# Patient Record
Sex: Female | Born: 1950 | Race: White | Hispanic: No | Smoking: Current every day smoker
Health system: Southern US, Community
[De-identification: ages and names within clinical notes are randomized; demographics above are authoritative.]

## PROBLEM LIST (undated history)

## (undated) DIAGNOSIS — C801 Malignant (primary) neoplasm, unspecified: Secondary | ICD-10-CM

## (undated) DIAGNOSIS — IMO0002 Reserved for concepts with insufficient information to code with codable children: Secondary | ICD-10-CM

## (undated) DIAGNOSIS — G43909 Migraine, unspecified, not intractable, without status migrainosus: Secondary | ICD-10-CM

## (undated) DIAGNOSIS — M1990 Unspecified osteoarthritis, unspecified site: Secondary | ICD-10-CM

## (undated) DIAGNOSIS — M329 Systemic lupus erythematosus, unspecified: Secondary | ICD-10-CM

## (undated) HISTORY — PX: CHOLECYSTECTOMY: SHX55

## (undated) HISTORY — PX: ABDOMINAL HYSTERECTOMY: SHX81

---

## 2017-09-23 ENCOUNTER — Other Ambulatory Visit: Payer: Self-pay

## 2017-09-23 ENCOUNTER — Emergency Department (HOSPITAL_COMMUNITY)
Admission: EM | Admit: 2017-09-23 | Discharge: 2017-09-23 | Disposition: A | Discharge status: Home / Self Care | Payer: Medicare Other | Attending: Emergency Medicine | Admitting: Emergency Medicine

## 2017-09-23 ENCOUNTER — Encounter (HOSPITAL_COMMUNITY): Payer: Self-pay | Admitting: *Deleted

## 2017-09-23 ENCOUNTER — Emergency Department (HOSPITAL_COMMUNITY): Payer: Medicare Other

## 2017-09-23 DIAGNOSIS — R52 Pain, unspecified: Secondary | ICD-10-CM | POA: Diagnosis present

## 2017-09-23 DIAGNOSIS — J111 Influenza due to unidentified influenza virus with other respiratory manifestations: Secondary | ICD-10-CM

## 2017-09-23 DIAGNOSIS — J029 Acute pharyngitis, unspecified: Secondary | ICD-10-CM | POA: Insufficient documentation

## 2017-09-23 DIAGNOSIS — Z87891 Personal history of nicotine dependence: Secondary | ICD-10-CM | POA: Diagnosis not present

## 2017-09-23 DIAGNOSIS — R0602 Shortness of breath: Secondary | ICD-10-CM | POA: Insufficient documentation

## 2017-09-23 DIAGNOSIS — R05 Cough: Secondary | ICD-10-CM | POA: Insufficient documentation

## 2017-09-23 DIAGNOSIS — R51 Headache: Secondary | ICD-10-CM | POA: Diagnosis not present

## 2017-09-23 DIAGNOSIS — Z85118 Personal history of other malignant neoplasm of bronchus and lung: Secondary | ICD-10-CM | POA: Insufficient documentation

## 2017-09-23 DIAGNOSIS — R69 Illness, unspecified: Secondary | ICD-10-CM

## 2017-09-23 DIAGNOSIS — J449 Chronic obstructive pulmonary disease, unspecified: Secondary | ICD-10-CM | POA: Insufficient documentation

## 2017-09-23 DIAGNOSIS — R197 Diarrhea, unspecified: Secondary | ICD-10-CM | POA: Diagnosis not present

## 2017-09-23 HISTORY — DX: Reserved for concepts with insufficient information to code with codable children: IMO0002

## 2017-09-23 HISTORY — DX: Unspecified osteoarthritis, unspecified site: M19.90

## 2017-09-23 HISTORY — DX: Migraine, unspecified, not intractable, without status migrainosus: G43.909

## 2017-09-23 HISTORY — DX: Systemic lupus erythematosus, unspecified: M32.9

## 2017-09-23 HISTORY — DX: Malignant (primary) neoplasm, unspecified: C80.1

## 2017-09-23 LAB — CBC WITH DIFFERENTIAL/PLATELET
BASOS ABS: 0 10*3/uL (ref 0.0–0.1)
BASOS PCT: 0 %
EOS ABS: 0 10*3/uL (ref 0.0–0.7)
Eosinophils Relative: 0 %
HEMATOCRIT: 43.2 % (ref 36.0–46.0)
HEMOGLOBIN: 13.7 g/dL (ref 12.0–15.0)
Lymphocytes Relative: 17 %
Lymphs Abs: 0.9 10*3/uL (ref 0.7–4.0)
MCH: 30.2 pg (ref 26.0–34.0)
MCHC: 31.7 g/dL (ref 30.0–36.0)
MCV: 95.4 fL (ref 78.0–100.0)
Monocytes Absolute: 1.1 10*3/uL — ABNORMAL HIGH (ref 0.1–1.0)
Monocytes Relative: 21 %
NEUTROS ABS: 3.4 10*3/uL (ref 1.7–7.7)
NEUTROS PCT: 62 %
Platelets: 159 10*3/uL (ref 150–400)
RBC: 4.53 MIL/uL (ref 3.87–5.11)
RDW: 14.6 % (ref 11.5–15.5)
WBC: 5.4 10*3/uL (ref 4.0–10.5)

## 2017-09-23 LAB — COMPREHENSIVE METABOLIC PANEL
ALK PHOS: 56 U/L (ref 38–126)
ALT: 26 U/L (ref 14–54)
ANION GAP: 11 (ref 5–15)
AST: 53 U/L — ABNORMAL HIGH (ref 15–41)
Albumin: 3.5 g/dL (ref 3.5–5.0)
BILIRUBIN TOTAL: 0.6 mg/dL (ref 0.3–1.2)
BUN: 15 mg/dL (ref 6–20)
CALCIUM: 8.1 mg/dL — AB (ref 8.9–10.3)
CO2: 23 mmol/L (ref 22–32)
CREATININE: 0.85 mg/dL (ref 0.44–1.00)
Chloride: 101 mmol/L (ref 101–111)
Glucose, Bld: 73 mg/dL (ref 65–99)
Potassium: 3.1 mmol/L — ABNORMAL LOW (ref 3.5–5.1)
SODIUM: 135 mmol/L (ref 135–145)
TOTAL PROTEIN: 7.2 g/dL (ref 6.5–8.1)

## 2017-09-23 MED ORDER — ACETAMINOPHEN 500 MG PO TABS
1000.0000 mg | ORAL_TABLET | Freq: Once | ORAL | Status: AC
  Administered 2017-09-23: 1000 mg via ORAL
  Filled 2017-09-23: qty 2

## 2017-09-23 MED ORDER — SODIUM CHLORIDE 0.9 % IV BOLUS (SEPSIS)
2000.0000 mL | Freq: Once | INTRAVENOUS | Status: AC
  Administered 2017-09-23: 2000 mL via INTRAVENOUS

## 2017-09-23 MED ORDER — LOPERAMIDE HCL 2 MG PO CAPS
4.0000 mg | ORAL_CAPSULE | Freq: Once | ORAL | Status: AC
  Administered 2017-09-23: 4 mg via ORAL
  Filled 2017-09-23: qty 2

## 2017-09-23 MED ORDER — POTASSIUM CHLORIDE CRYS ER 20 MEQ PO TBCR
40.0000 meq | EXTENDED_RELEASE_TABLET | Freq: Once | ORAL | Status: AC
  Administered 2017-09-23: 40 meq via ORAL
  Filled 2017-09-23: qty 2

## 2017-09-23 MED ORDER — BENZONATATE 100 MG PO CAPS: 100.0000 mg | ORAL_CAPSULE | Freq: Three times a day (TID) | ORAL | 0 refills | Status: AC

## 2017-09-23 MED ORDER — BENZONATATE 100 MG PO CAPS
100.0000 mg | ORAL_CAPSULE | Freq: Once | ORAL | Status: AC
  Administered 2017-09-23: 100 mg via ORAL
  Filled 2017-09-23: qty 1

## 2017-09-23 NOTE — ED Triage Notes (Signed)
Flu like symptoms

## 2017-09-23 NOTE — Discharge Instructions (Signed)
Make sure that you drink at least six 8 ounce glasses of water or Gatorade each day in order to stay well-hydrated.  Tylenol as directed every 4 hours for aches or for temperature higher than 100.4.  The medication prescribed as needed for cough.  Use your inhalers or nebulizer as directed for shortness of breath.  Avoid milk or foods containing milk such as cheese or ice cream while having diarrhea.  You can take Imodium as directed as needed for diarrhea.  See your primary care physician if not improving in 4 or 5 days.  Return for worsening weakness,shortness of breath, vomiting, or if you are concerned for any reason.

## 2017-09-23 NOTE — ED Provider Notes (Addendum)
[facility] EMERGENCY DEPARTMENT Provider Note   CSN: [id number] Arrival date & time: 09/23/17  1349     History   Chief Complaint Chief Complaint  Patient presents with  . Generalized Body Aches    HPI Carmen Hansen is a 66 y.o. female.  HPI  complains  of generalized weakness, cough productive of clear sputum shortness of breath diarrhea and subjective fever onset 2 days ago.  Other associated symptoms include diffuse myalgias, sore throat, diminished appetite and headache.  No treatment prior to coming here.  Nothing makes symptoms better or worse.  No vomiting or nausea patient reports "I think I have the flu" Past Medical History:  Diagnosis Date  . Arthritis   . Cancer ([place])   . Lupus   . Migraines   Lung cancer-resolved COPD     OB History    No data available       Home Medications   Symbicort, Zyrtec,chloecalciferol, cyclobenzaprine, Dexilant docusate Cymbalta folic acid gabapentin Plaquenil DuoNeb, Singulair, prednisone 5 mg daily Maxalt Spiriva topiramate tramadol Ambien Prior to Admission medications   Not on File    Family History No family history on file.  Social History Social History   Tobacco Use  . Smoking status: Former Research scientist (life sciences)  . Smokeless tobacco: Never Used  Substance Use Topics  . Alcohol use: No    Frequency: Never  . Drug use: No     Allergies   Arava [leflunomide]; Daliresp [roflumilast]; Lortab [hydrocodone-acetaminophen]; and Sulfa antibiotics   Review of Systems Review of Systems  Constitutional: Positive for appetite change and fever.       Subjective fever  HENT: Negative.   Respiratory: Positive for cough and shortness of breath.   Cardiovascular: Negative.   Gastrointestinal: Positive for diarrhea.  Musculoskeletal: Positive for myalgias.       Diffuse myalgias  Skin: Negative.   Allergic/Immunologic: Positive for immunocompromised state.       Chronically on steroids  Neurological: Positive for weakness.    Psychiatric/Behavioral: Negative.   All other systems reviewed and are negative.    Physical Exam Updated Vital Signs BP 129/72   Pulse 88   Temp 98.7 F (37.1 C)   Resp (!) 24   Ht 5' 5.5" (1.664 m)   Wt 88.9 kg (196 lb)   SpO2 98%   BMI 32.12 kg/m   Physical Exam  Constitutional: She is oriented to person, place, and time. She appears well-developed and well-nourished.  Mildly ill-appearing nontoxic  HENT:  Head: Normocephalic and atraumatic.  Mucous membranes dry  Eyes: Conjunctivae are normal. Pupils are equal, round, and reactive to light.  Neck: Neck supple. No tracheal deviation present. No thyromegaly present.  Cardiovascular: Normal rate and regular rhythm.  No murmur heard. Pulmonary/Chest: Effort normal and breath sounds normal.  Abdominal: Soft. Bowel sounds are normal. She exhibits no distension. There is no tenderness.  Musculoskeletal: Normal range of motion. She exhibits no edema or tenderness.  Neurological: She is alert and oriented to person, place, and time. Coordination normal.  Skin: Skin is warm and dry. No rash noted.  Psychiatric: She has a normal mood and affect.  Nursing note and vitals reviewed.    ED Treatments / Results  Labs (all labs ordered are listed, but only abnormal results are displayed) Labs Reviewed  COMPREHENSIVE METABOLIC PANEL  CBC WITH DIFFERENTIAL/PLATELET    EKG  EKG Interpretation None      Chest x-ray reviewed by me Results for orders placed or performed  during the hospital encounter of 09/23/17  Comprehensive metabolic panel  Result Value Ref Range   Sodium 135 135 - 145 mmol/L   Potassium 3.1 (L) 3.5 - 5.1 mmol/L   Chloride 101 101 - 111 mmol/L   CO2 23 22 - 32 mmol/L   Glucose, Bld 73 65 - 99 mg/dL   BUN 15 6 - 20 mg/dL   Creatinine, Ser 0.85 0.44 - 1.00 mg/dL   Calcium 8.1 (L) 8.9 - 10.3 mg/dL   Total Protein 7.2 6.5 - 8.1 g/dL   Albumin 3.5 3.5 - 5.0 g/dL   AST 53 (H) 15 - 41 U/L   ALT 26 14 - 54  U/L   Alkaline Phosphatase 56 38 - 126 U/L   Total Bilirubin 0.6 0.3 - 1.2 mg/dL   GFR calc non Af Amer >60 >60 mL/min   GFR calc Af Amer >60 >60 mL/min   Anion gap 11 5 - 15  CBC with Differential/Platelet  Result Value Ref Range   WBC 5.4 4.0 - 10.5 K/uL   RBC 4.53 3.87 - 5.11 MIL/uL   Hemoglobin 13.7 12.0 - 15.0 g/dL   HCT 43.2 36.0 - 46.0 %   MCV 95.4 78.0 - 100.0 fL   MCH 30.2 26.0 - 34.0 pg   MCHC 31.7 30.0 - 36.0 g/dL   RDW 14.6 11.5 - 15.5 %   Platelets 159 150 - 400 K/uL   Neutrophils Relative % 62 %   Neutro Abs 3.4 1.7 - 7.7 K/uL   Lymphocytes Relative 17 %   Lymphs Abs 0.9 0.7 - 4.0 K/uL   Monocytes Relative 21 %   Monocytes Absolute 1.1 (H) 0.1 - 1.0 K/uL   Eosinophils Relative 0 %   Eosinophils Absolute 0.0 0.0 - 0.7 K/uL   Basophils Relative 0 %   Basophils Absolute 0.0 0.0 - 0.1 K/uL   Dg Chest 2 View  Result Date: 09/23/2017 CLINICAL DATA:  66 year old female presenting with chills, cough, and congestion for a few days. EXAM: CHEST  2 VIEW COMPARISON:  None. FINDINGS: The heart size and mediastinal contours are within normal limits. No alveolar consolidations. Mild bronchitic change of both lungs with left basilar atelectasis. No effusion nor overt pulmonary edema. Status post vertebral augmentation at what appears to be L1. The visualized skeletal structures are nonacute. IMPRESSION: Mild interstitial prominence which may reflect chronic bronchitic change. No alveolar consolidation to suggest pneumonia. Electronically Signed   By: Ashley Royalty M.D.   On: 09/23/2017 17:27   Radiology No results found.  Procedures Procedures (including critical care time)  Medications Ordered in ED Medications  sodium chloride 0.9 % bolus 2,000 mL (not administered)  loperamide (IMODIUM) capsule 4 mg (not administered)     Initial Impression / Assessment and Plan / ED Course  I have reviewed the triage vital signs and the nursing notes.  Pertinent labs & imaging results  that were available during my care of the patient were reviewed by me and considered in my medical decision making (see chart for details).     9 PM patient feels much improved after treatment with intravenous hydration and oral potassium.  Coughing less after treatment with Tessalon.  She feels ready to go home.plan Tylenol for aches or for temperature higher than 100.4.  Encourage oral hydration.  Prescription Tessalon.  Imodium as directed.  Avoid dairy.  She is to use her inhalers as directed as needed for shortness of breath.  Follow-up with primary care physician if  not improved in 4-5 days.  Return if condition worsens for any reason, shortness of breath, vomiting/ Final Clinical Impressions(s) / ED Diagnoses  Dx #1 influenza-like illness #2 diarrhea #3 hypokalemia #4 dehydration Final diagnoses:  None    ED Discharge Orders    None       Orlie Dakin, MD 09/23/17 2112    Orlie Dakin, MD 09/23/17 2116

## 2018-06-11 DIAGNOSIS — K589 Irritable bowel syndrome without diarrhea: Secondary | ICD-10-CM | POA: Insufficient documentation

## 2018-06-11 DIAGNOSIS — I7300 Raynaud's syndrome without gangrene: Secondary | ICD-10-CM | POA: Insufficient documentation

## 2018-06-11 DIAGNOSIS — M810 Age-related osteoporosis without current pathological fracture: Secondary | ICD-10-CM | POA: Insufficient documentation

## 2018-06-11 DIAGNOSIS — K5790 Diverticulosis of intestine, part unspecified, without perforation or abscess without bleeding: Secondary | ICD-10-CM | POA: Insufficient documentation

## 2018-06-11 DIAGNOSIS — M797 Fibromyalgia: Secondary | ICD-10-CM | POA: Insufficient documentation

## 2018-06-11 DIAGNOSIS — J45909 Unspecified asthma, uncomplicated: Secondary | ICD-10-CM | POA: Insufficient documentation

## 2018-07-21 DIAGNOSIS — E559 Vitamin D deficiency, unspecified: Secondary | ICD-10-CM | POA: Insufficient documentation

## 2018-07-21 DIAGNOSIS — M06041 Rheumatoid arthritis without rheumatoid factor, right hand: Secondary | ICD-10-CM | POA: Insufficient documentation

## 2018-08-28 DIAGNOSIS — G4700 Insomnia, unspecified: Secondary | ICD-10-CM | POA: Insufficient documentation

## 2018-11-25 IMAGING — DX DG CHEST 2V
2 series · 2 of 2 positions shown · non-contrast
Comparison: None.

CLINICAL DATA: 66-year-old female presenting with chills, cough,
and congestion for a few days.

EXAM:
CHEST  2 VIEW

[chest pa]
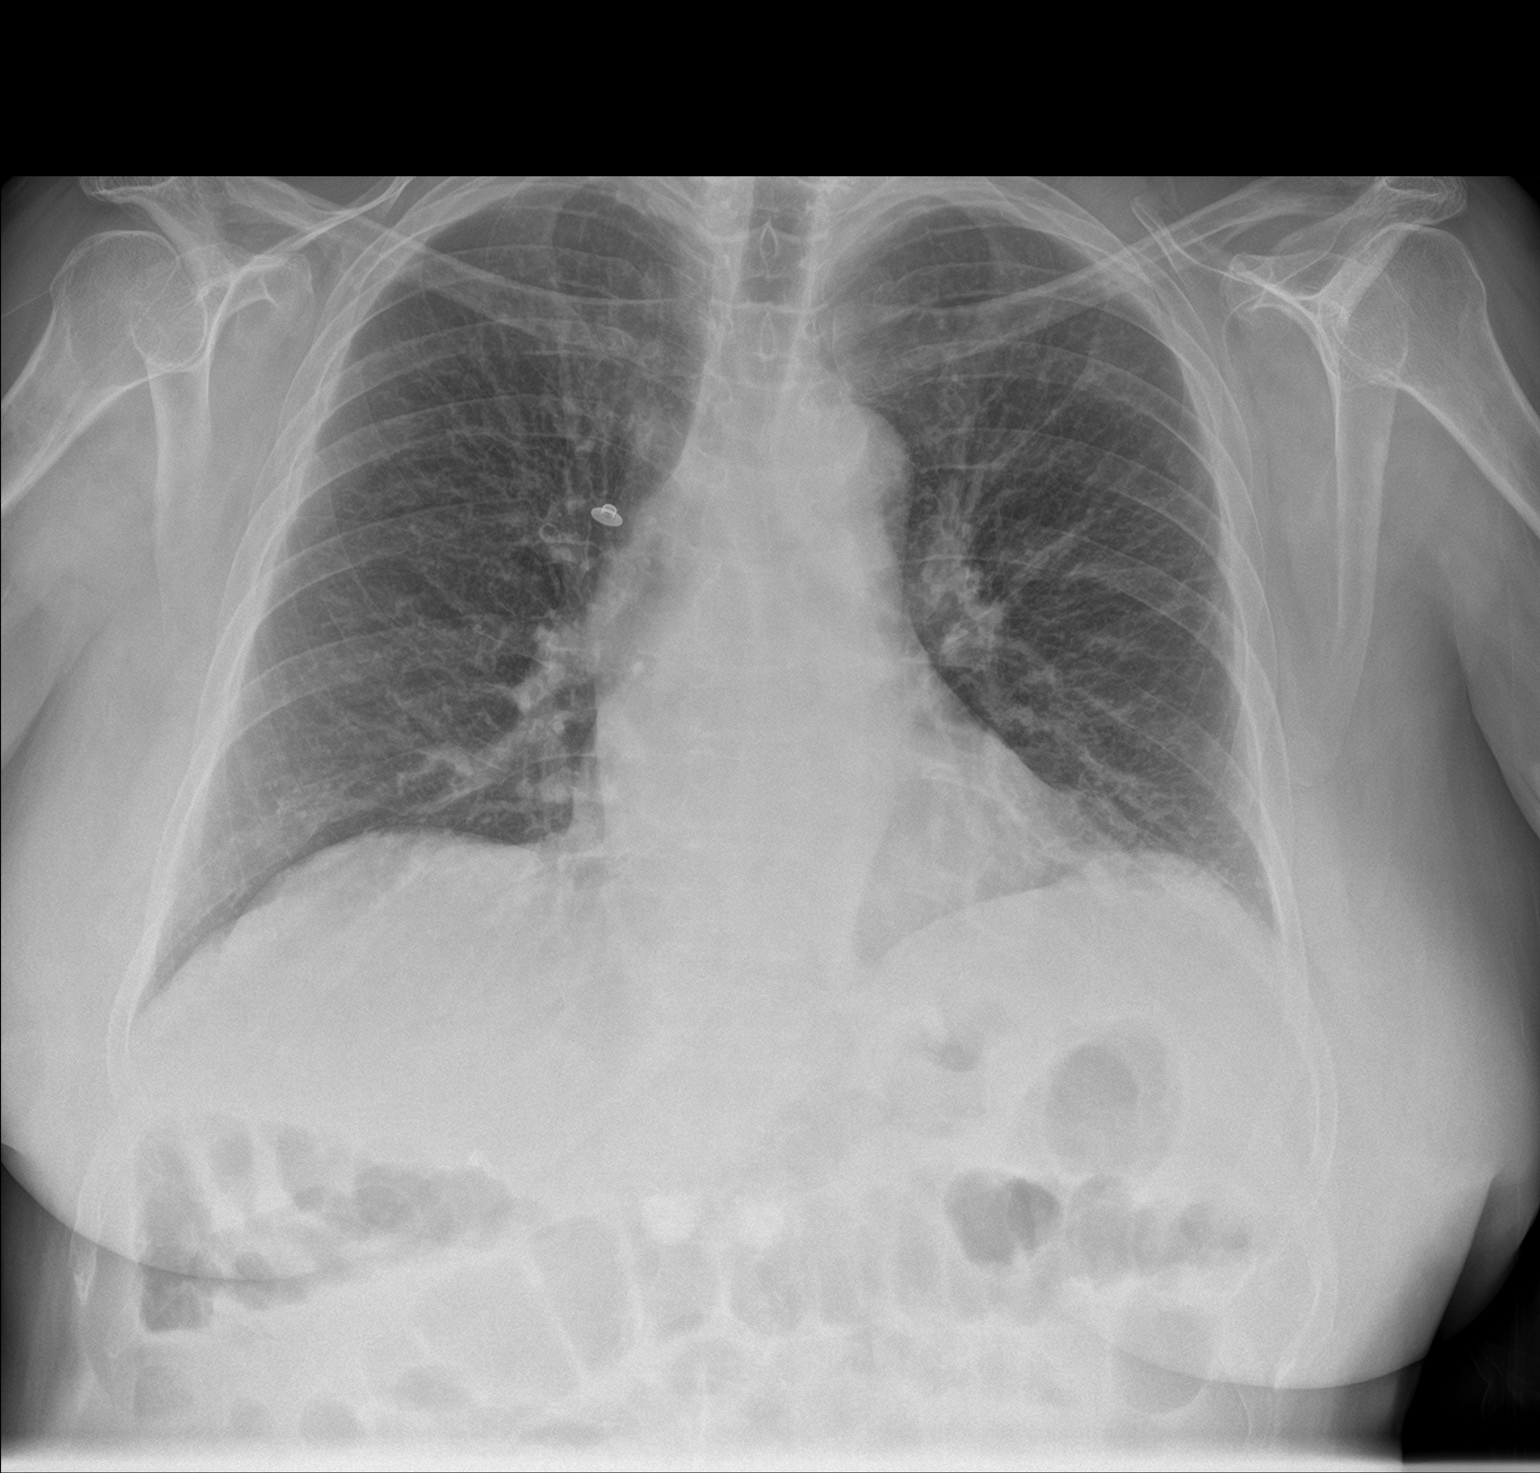

[chest lat]
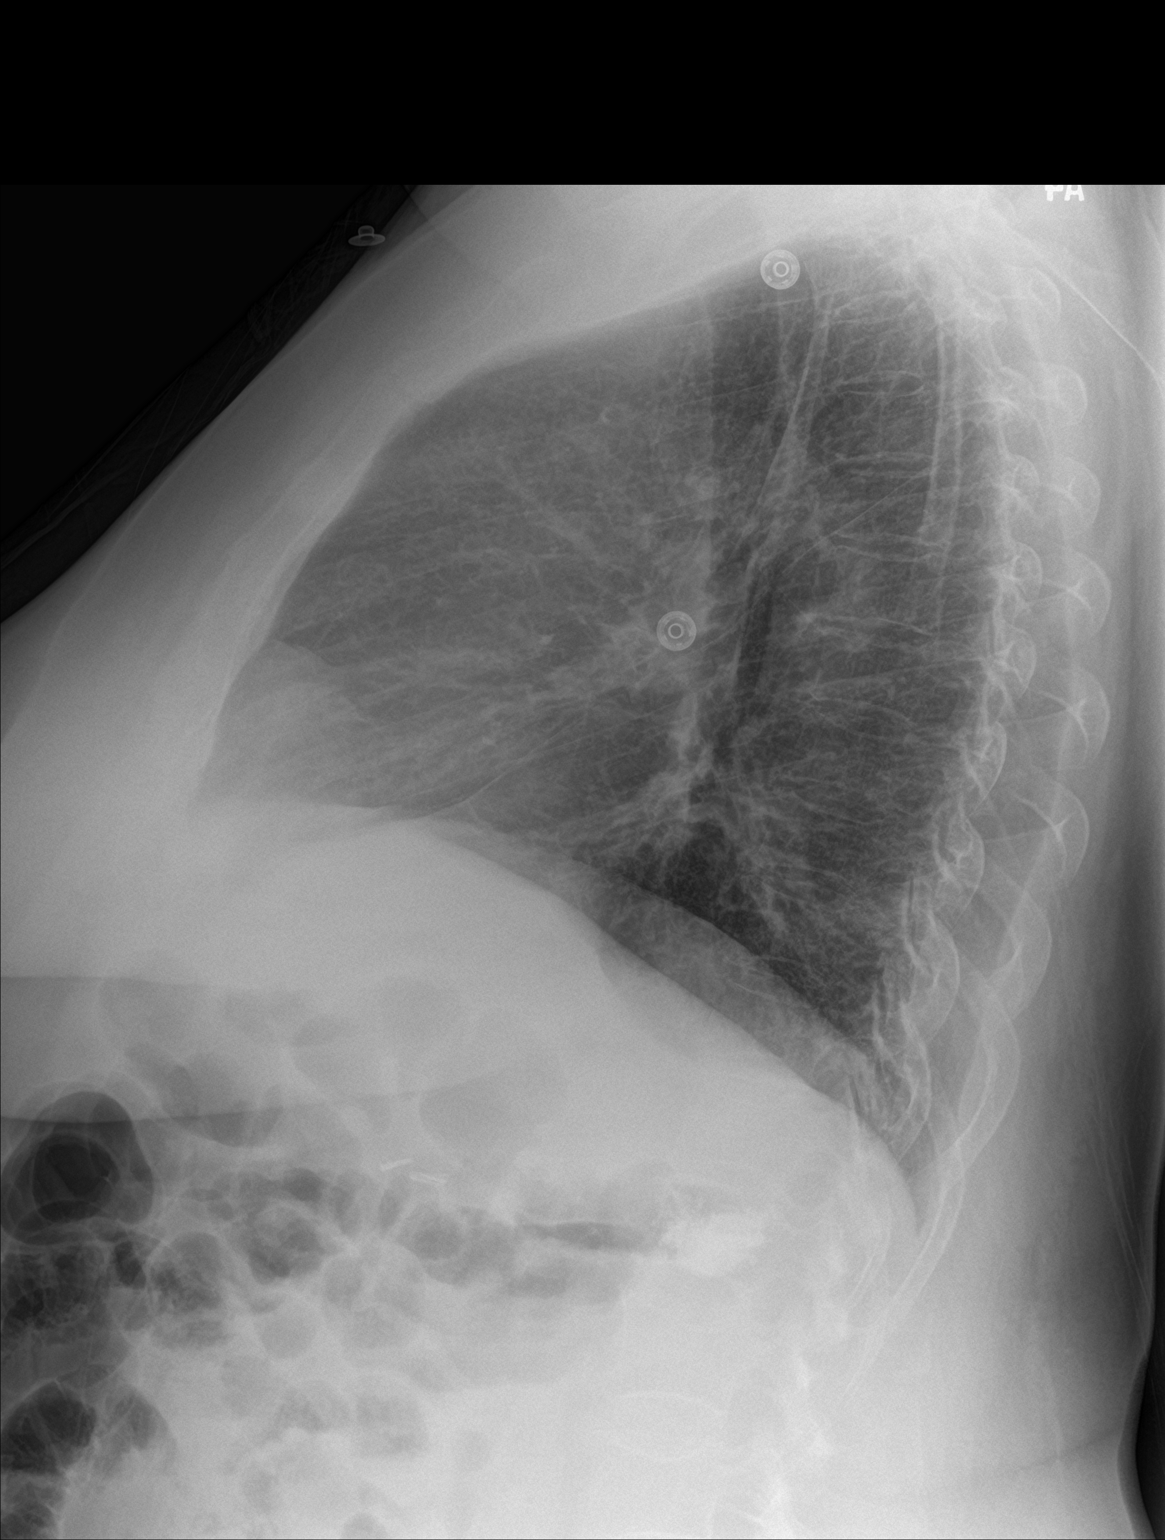

[2 of 2 positions shown; findings below may reference images not displayed]

FINDINGS: The heart size and mediastinal contours are within normal limits. No
alveolar consolidations. Mild bronchitic change of both lungs with
left basilar atelectasis. No effusion nor overt pulmonary edema.
Status post vertebral augmentation at what appears to be L1. The
visualized skeletal structures are nonacute.
IMPRESSION: Mild interstitial prominence which may reflect chronic bronchitic
change. No alveolar consolidation to suggest pneumonia.

## 2019-09-13 DIAGNOSIS — M160 Bilateral primary osteoarthritis of hip: Secondary | ICD-10-CM | POA: Insufficient documentation

## 2019-09-30 DIAGNOSIS — B001 Herpesviral vesicular dermatitis: Secondary | ICD-10-CM | POA: Insufficient documentation

## 2019-12-29 DIAGNOSIS — K219 Gastro-esophageal reflux disease without esophagitis: Secondary | ICD-10-CM | POA: Insufficient documentation

## 2020-12-28 DIAGNOSIS — E669 Obesity, unspecified: Secondary | ICD-10-CM | POA: Insufficient documentation

## 2021-03-26 DIAGNOSIS — M546 Pain in thoracic spine: Secondary | ICD-10-CM | POA: Insufficient documentation

## 2021-05-24 DIAGNOSIS — M1990 Unspecified osteoarthritis, unspecified site: Secondary | ICD-10-CM | POA: Insufficient documentation

## 2021-05-29 DIAGNOSIS — Z9889 Other specified postprocedural states: Secondary | ICD-10-CM | POA: Insufficient documentation

## 2021-08-20 DIAGNOSIS — F419 Anxiety disorder, unspecified: Secondary | ICD-10-CM | POA: Insufficient documentation

## 2022-06-03 DIAGNOSIS — I10 Essential (primary) hypertension: Secondary | ICD-10-CM | POA: Insufficient documentation

## 2022-07-19 LAB — HM COLONOSCOPY

## 2023-09-28 ENCOUNTER — Encounter (HOSPITAL_COMMUNITY): Payer: Self-pay

## 2023-09-28 ENCOUNTER — Other Ambulatory Visit: Payer: Self-pay

## 2023-09-28 ENCOUNTER — Emergency Department (HOSPITAL_COMMUNITY): Payer: Medicare Other

## 2023-09-28 ENCOUNTER — Emergency Department (HOSPITAL_COMMUNITY)
Admission: EM | Admit: 2023-09-28 | Discharge: 2023-09-28 | Disposition: A | Discharge status: Home / Self Care | Payer: Medicare Other | Attending: Emergency Medicine | Admitting: Emergency Medicine

## 2023-09-28 DIAGNOSIS — D509 Iron deficiency anemia, unspecified: Secondary | ICD-10-CM | POA: Insufficient documentation

## 2023-09-28 DIAGNOSIS — R0602 Shortness of breath: Secondary | ICD-10-CM | POA: Diagnosis present

## 2023-09-28 DIAGNOSIS — R600 Localized edema: Secondary | ICD-10-CM | POA: Insufficient documentation

## 2023-09-28 DIAGNOSIS — J449 Chronic obstructive pulmonary disease, unspecified: Secondary | ICD-10-CM | POA: Diagnosis not present

## 2023-09-28 LAB — CBC WITH DIFFERENTIAL/PLATELET
Abs Immature Granulocytes: 0.01 10*3/uL (ref 0.00–0.07)
Basophils Absolute: 0.1 10*3/uL (ref 0.0–0.1)
Basophils Relative: 1 %
Eosinophils Absolute: 0 10*3/uL (ref 0.0–0.5)
Eosinophils Relative: 1 %
HCT: 28.5 % — ABNORMAL LOW (ref 36.0–46.0)
Hemoglobin: 8.1 g/dL — ABNORMAL LOW (ref 12.0–15.0)
Immature Granulocytes: 0 %
Lymphocytes Relative: 17 %
Lymphs Abs: 1 10*3/uL (ref 0.7–4.0)
MCH: 22.3 pg — ABNORMAL LOW (ref 26.0–34.0)
MCHC: 28.4 g/dL — ABNORMAL LOW (ref 30.0–36.0)
MCV: 78.3 fL — ABNORMAL LOW (ref 80.0–100.0)
Monocytes Absolute: 0.4 10*3/uL (ref 0.1–1.0)
Monocytes Relative: 7 %
Neutro Abs: 4.6 10*3/uL (ref 1.7–7.7)
Neutrophils Relative %: 74 %
Platelets: 250 10*3/uL (ref 150–400)
RBC: 3.64 MIL/uL — ABNORMAL LOW (ref 3.87–5.11)
RDW: 18.7 % — ABNORMAL HIGH (ref 11.5–15.5)
WBC: 6.1 10*3/uL (ref 4.0–10.5)
nRBC: 0 % (ref 0.0–0.2)

## 2023-09-28 LAB — RETICULOCYTES
Immature Retic Fract: 27.9 % — ABNORMAL HIGH (ref 2.3–15.9)
RBC.: 3.69 MIL/uL — ABNORMAL LOW (ref 3.87–5.11)
Retic Count, Absolute: 84.1 10*3/uL (ref 19.0–186.0)
Retic Ct Pct: 2.3 % (ref 0.4–3.1)

## 2023-09-28 LAB — BLOOD GAS, VENOUS
Acid-Base Excess: 1.7 mmol/L (ref 0.0–2.0)
Bicarbonate: 26.6 mmol/L (ref 20.0–28.0)
Drawn by: 65579
O2 Saturation: 47.3 %
Patient temperature: 37.1
pCO2, Ven: 42 mm[Hg] — ABNORMAL LOW (ref 44–60)
pH, Ven: 7.41 (ref 7.25–7.43)
pO2, Ven: <31 mm[Hg] — CL (ref 32–45)

## 2023-09-28 LAB — VITAMIN B12: Vitamin B-12: 347 pg/mL (ref 180–914)

## 2023-09-28 LAB — COMPREHENSIVE METABOLIC PANEL
ALT: 16 U/L (ref 0–44)
AST: 19 U/L (ref 15–41)
Albumin: 3.6 g/dL (ref 3.5–5.0)
Alkaline Phosphatase: 75 U/L (ref 38–126)
Anion gap: 9 (ref 5–15)
BUN: 12 mg/dL (ref 8–23)
CO2: 25 mmol/L (ref 22–32)
Calcium: 8.9 mg/dL (ref 8.9–10.3)
Chloride: 102 mmol/L (ref 98–111)
Creatinine, Ser: 0.89 mg/dL (ref 0.44–1.00)
GFR, Estimated: >60 mL/min (ref >60)
Glucose, Bld: 98 mg/dL (ref 70–99)
Potassium: 3.8 mmol/L (ref 3.5–5.1)
Sodium: 136 mmol/L (ref 135–145)
Total Bilirubin: 0.5 mg/dL (ref <1.2)
Total Protein: 6.8 g/dL (ref 6.5–8.1)

## 2023-09-28 LAB — IRON AND TIBC
Iron: 20 ug/dL — ABNORMAL LOW (ref 28–170)
Saturation Ratios: 4 % — ABNORMAL LOW (ref 10.4–31.8)
TIBC: 474 ug/dL — ABNORMAL HIGH (ref 250–450)
UIBC: 454 ug/dL

## 2023-09-28 LAB — FERRITIN: Ferritin: 6 ng/mL — ABNORMAL LOW (ref 11–307)

## 2023-09-28 LAB — FOLATE: Folate: 36.3 ng/mL (ref >5.9)

## 2023-09-28 MED ORDER — FUROSEMIDE 20 MG PO TABS: 20.0000 mg | ORAL_TABLET | Freq: Every day | ORAL | 0 refills | Status: AC

## 2023-09-28 MED ORDER — FERROUS SULFATE 325 (65 FE) MG PO TABS: 325.0000 mg | ORAL_TABLET | Freq: Every day | ORAL | 0 refills | Status: DC

## 2023-09-28 MED ORDER — FUROSEMIDE 10 MG/ML IJ SOLN
40.0000 mg | Freq: Once | INTRAMUSCULAR | Status: AC
Start: 2023-09-28 — End: 2023-09-28
  Administered 2023-09-28: 40 mg via INTRAVENOUS
  Filled 2023-09-28: qty 4

## 2023-09-28 MED ORDER — FERROUS SULFATE 325 (65 FE) MG PO TABS
325.0000 mg | ORAL_TABLET | Freq: Every day | ORAL | 0 refills | Status: DC
Start: 2023-09-28 — End: 2023-09-28

## 2023-09-28 MED ORDER — ALBUTEROL SULFATE HFA 108 (90 BASE) MCG/ACT IN AERS: 2.0000 | INHALATION_SPRAY | RESPIRATORY_TRACT | Status: DC | PRN

## 2023-09-28 MED ORDER — IPRATROPIUM-ALBUTEROL 0.5-2.5 (3) MG/3ML IN SOLN
3.0000 mL | Freq: Once | RESPIRATORY_TRACT | Status: AC
  Administered 2023-09-28: 3 mL via RESPIRATORY_TRACT
  Filled 2023-09-28: qty 3

## 2023-09-28 MED ORDER — FUROSEMIDE 20 MG PO TABS: 20.0000 mg | ORAL_TABLET | Freq: Every day | ORAL | 0 refills | Status: DC

## 2023-09-28 NOTE — ED Provider Notes (Signed)
[place] EMERGENCY DEPARTMENT AT [facility] Provider Note   CSN: [id number] Arrival date & time: 09/28/23  1308     History  Chief Complaint  Patient presents with   Shortness of Breath    Carmen Hansen is a 72 y.o. female.  Pt is a 72 yo female with pmhx significant for copd (on 2-3 L oxygen via [place] at home), arthritis, lupus, migraines, continued tobacco abuse, anemia, chronic back pain, insomnia, hx adenocarcinoma right lung, dm2, fibromyalgia, and IBS.  Pt had been living in [place], [place] until about a week ago.  She had to move in with her sister who lives here.  Pt saw her pcp prior to leaving and was given her medications.  She has made an appt with pcp here, but that is not until Feb.  Pt said she was told to f/u with cards for sob and heme-onc for chronic anemia, but did not make appts because she moved up here.  Pt is here today because of increased sob and bilateral leg swelling.  Pt denies f/c.       Home Medications Prior to Admission medications   Medication Sig Start Date End Date Taking? Authorizing Provider  ferrous sulfate 325 (65 FE) MG tablet Take 1 tablet (325 mg total) by mouth daily. 09/28/23  Yes Jacalyn Lefevre, MD  furosemide (LASIX) 20 MG tablet Take 1 tablet (20 mg total) by mouth daily. 09/28/23  Yes Jacalyn Lefevre, MD  albuterol [facility] HFA) 108 (90 Base) MCG/ACT inhaler Inhale 1-2 puffs into the lungs every 6 (six) hours as needed for wheezing or shortness of breath.  03/10/10   [provider]  benzonatate (TESSALON) 100 MG capsule Take 1 capsule (100 mg total) by mouth every 8 (eight) hours. 09/23/17   Doug Sou, MD  BIOTIN PO Take 1 tablet by mouth daily.    [provider]  budesonide-formoterol (SYMBICORT) 80-4.5 MCG/ACT inhaler Inhale 2 puffs into the lungs 2 (two) times daily.    [provider]  cetirizine (ZYRTEC) 10 MG tablet Take 10 mg by mouth daily.    [provider]  Cholecalciferol  (VITAMIN D3) 5000 units CAPS Take 1 capsule by mouth daily.    [provider]  cyclobenzaprine (FLEXERIL) 5 MG tablet Take 5 mg by mouth at bedtime.    [provider]  dexlansoprazole (DEXILANT) 60 MG capsule Take 60 mg by mouth daily. 03/10/10   [provider]  docusate sodium (COLACE) 100 MG capsule Take 200 mg by mouth daily.    [provider]  DULoxetine (CYMBALTA) 60 MG capsule Take 60 mg by mouth daily. 04/03/10   [provider]  folic acid (FOLVITE) 1 MG tablet Take 1 mg by mouth daily. 03/09/10   [provider]  gabapentin (NEURONTIN) 800 MG tablet Take 800 mg by mouth 4 (four) times daily. 07/29/17   [provider]  hydroxychloroquine (PLAQUENIL) 200 MG tablet Take 200 mg by mouth 2 (two) times daily. 01/10/10   [provider]  ipratropium-albuterol (DUONEB) 0.5-2.5 (3) MG/3ML SOLN Take 3 mLs by nebulization 4 (four) times daily.    [provider]  montelukast (SINGULAIR) 10 MG tablet Take 10 mg by mouth at bedtime. 09/18/17   [provider]  predniSONE (DELTASONE) 5 MG tablet Take 5 mg by mouth daily with breakfast.    [provider]  rizatriptan (MAXALT) 10 MG tablet Take 10 mg by mouth as needed for migraine.  12/21/09   [provider]  SPIRIVA RESPIMAT 2.5 MCG/ACT AERS Inhale 1 spray into the lungs daily. 09/01/17   [provider]  topiramate (TOPAMAX) 50 MG tablet Take 50 mg by mouth 2 (two) times daily.    [provider]  traMADol (ULTRAM) 50 MG tablet Take 50 mg by mouth every 6 (six) hours as needed for moderate pain.    [provider]  zolpidem (AMBIEN) 10 MG tablet Take 10 mg by mouth at bedtime. 03/24/10   [provider]      Allergies    Arava [leflunomide], Daliresp [roflumilast], Lortab [hydrocodone-acetaminophen], and Sulfa antibiotics    Review of Systems   Review of Systems  Respiratory:  Positive for shortness of  breath.   Cardiovascular:  Positive for leg swelling.  All other systems reviewed and are negative.   Physical Exam Updated Vital Signs BP 123/71   Pulse 89   Temp 98.7 F (37.1 C) (Oral)   Resp (!) 26   Ht 5\' 5"  (1.651 m)   Wt 100.2 kg   SpO2 98%   BMI 36.78 kg/m  Physical Exam Vitals and nursing note reviewed.  Constitutional:      Appearance: She is well-developed.  HENT:     Head: Normocephalic and atraumatic.     Mouth/Throat:     Mouth: Mucous membranes are moist.     Pharynx: Oropharynx is clear.  Eyes:     Extraocular Movements: Extraocular movements intact.     Pupils: Pupils are equal, round, and reactive to light.  Cardiovascular:     Rate and Rhythm: Normal rate and regular rhythm.  Pulmonary:     Effort: Pulmonary effort is normal.     Breath sounds: Normal breath sounds.  Abdominal:     General: Bowel sounds are normal.     Palpations: Abdomen is soft.  Musculoskeletal:     Cervical back: Normal range of motion and neck supple.     Right lower leg: Edema present.     Left lower leg: Edema present.  Skin:    General: Skin is warm.     Capillary Refill: Capillary refill takes less than 2 seconds.  Neurological:     General: No focal deficit present.     Mental Status: She is alert and oriented to person, place, and time.  Psychiatric:        Mood and Affect: Mood normal.        Behavior: Behavior normal.     ED Results / Procedures / Treatments   Labs (all labs ordered are listed, but only abnormal results are displayed) Labs Reviewed  CBC WITH DIFFERENTIAL/PLATELET - Abnormal; Notable for the following components:      Result Value   RBC 3.64 (*)    Hemoglobin 8.1 (*)    HCT 28.5 (*)    MCV 78.3 (*)    MCH 22.3 (*)    MCHC 28.4 (*)    RDW 18.7 (*)    All other components within normal limits  BLOOD GAS, VENOUS - Abnormal; Notable for the following components:   pCO2, Ven 42 (*)    pO2, Ven <31 (*)    All other components within normal  limits  COMPREHENSIVE METABOLIC PANEL  VITAMIN B12  FOLATE  IRON AND TIBC  FERRITIN  RETICULOCYTES    EKG EKG Interpretation Date/Time:  Sunday September 28 2023 13:25:32 EST Ventricular Rate:  98 PR Interval:  126 QRS Duration:  74 QT Interval:  386 QTC Calculation: 492  R Axis:   9  Text Interpretation: Normal sinus rhythm Nonspecific ST and T wave abnormality Prolonged QT Abnormal ECG No previous ECGs available Confirmed by Jacalyn Lefevre [phone]) on 09/28/2023 1:27:06 PM  Radiology DG Chest Portable 1 View  Result Date: 09/28/2023 CLINICAL DATA:  Shortness of breath. On home oxygen. Lower extremity and abdominal swelling. EXAM: PORTABLE CHEST 1 VIEW COMPARISON:  Radiographs 09/23/2017. FINDINGS: 1438 hours. Lower lung volumes. The heart size and mediastinal contours are stable. Mildly increased retrocardiac opacity, probably reflecting atelectasis. No confluent airspace disease, definite edema, pleural effusion or pneumothorax. Patient has undergone interval lower cervical fusion and midthoracic spinal augmentation. No acute osseous findings are evident. Telemetry leads overlie the chest. IMPRESSION: Lower lung volumes with probable retrocardiac atelectasis. No definite acute cardiopulmonary process. Electronically Signed   By: Carey Bullocks M.D.   On: 09/28/2023 14:54    Procedures Procedures    Medications Ordered in ED Medications  furosemide (LASIX) injection 40 mg (40 mg Intravenous Given 09/28/23 1420)  ipratropium-albuterol (DUONEB) 0.5-2.5 (3) MG/3ML nebulizer solution 3 mL (3 mLs Nebulization Given 09/28/23 1436)    ED Course/ Medical Decision Making/ A&P                                 Medical Decision Making Amount and/or Complexity of Data Reviewed Labs: ordered. Radiology: ordered.  Risk Prescription drug management.   This patient presents to the ED for concern of sob, this involves an extensive number of treatment options, and is a complaint that  carries with it a high risk of complications and morbidity.  The differential diagnosis includes copd exac, chf, pna, covid/flu   Co morbidities that complicate the patient evaluation  copd (on 2-3 L oxygen via [place] at home), arthritis, lupus, migraines, continued tobacco abuse, anemia, chronic back pain, insomnia, hx adenocarcinoma right lung, dm2, fibromyalgia, and IBS   Additional history obtained:  Additional history obtained from epic chart review External records from outside source obtained and reviewed including sister   Lab Tests:  I Ordered, and personally interpreted labs.  The pertinent results include:  cbc with hgb 8.1 (hgb 8.5 on 11/15), cmp nl, vbg nl   Imaging Studies ordered:  I ordered imaging studies including cxr  I independently visualized and interpreted imaging which showed  Lower lung volumes with probable retrocardiac atelectasis. No  definite acute cardiopulmonary process.   I agree with the radiologist interpretation   Cardiac Monitoring:  The patient was maintained on a cardiac monitor.  I personally viewed and interpreted the cardiac monitored which showed an underlying rhythm of: nsr   Medicines ordered and prescription drug management:  I ordered medication including lasix  for sx  Reevaluation of the patient after these medicines showed that the patient improved I have reviewed the patients home medicines and have made adjustments as needed  Problem List / ED Course:  Peripheral edema:  pt is feeling better after iv lasix.  She feels well enough to go home.  She will be d/c with oral lasix and amb ref to cards.  She is also told to get compression socks. Anemia:  looks to be iron deficiency anemia.  This is stable.  Anemia panel ordered prior to d/c.  Amb ref to heme onc. SOB: chronic.  She is saturating well on 2 L.  She is given a ref to pulm as well.    Reevaluation:  After the interventions noted  above, I reevaluated the patient and  found that they have :improved   Social Determinants of Health:  Lives at home   Dispostion:  After consideration of the diagnostic results and the patients response to treatment, I feel that the patent would benefit from discharge with outpatient f/u.          Final Clinical Impression(s) / ED Diagnoses Final diagnoses:  Peripheral edema  Iron deficiency anemia, unspecified iron deficiency anemia type  Chronic obstructive pulmonary disease, unspecified COPD type (HCC)    Rx / DC Orders ED Discharge Orders          Ordered    ferrous sulfate 325 (65 FE) MG tablet  Daily        09/28/23 1528    furosemide (LASIX) 20 MG tablet  Daily        09/28/23 1528    Ambulatory referral to Cardiology        09/28/23 1533    Ambulatory referral to Hematology / Oncology        09/28/23 1533    Ambulatory referral to Pulmonology        09/28/23 1533              Jacalyn Lefevre, MD 09/28/23 1535

## 2023-09-28 NOTE — ED Triage Notes (Signed)
Pt arrived wearing [place] Always wears 3LPM when walking 2 at rest   Pt on iron infusions Complains of B/L ankle and feet and ABD swelling  Complains of SOB at rest

## 2023-09-28 NOTE — ED Notes (Signed)
Pt ambulated with 3lpm [place], per her normal. O2 saturation maintained at 97%. Shob present, but pt reports that it's within her normal.

## 2023-09-28 NOTE — Discharge Instructions (Signed)
Compression socks will also help your leg swelling.  I recommend you purchase a pair.

## 2023-10-08 ENCOUNTER — Inpatient Hospital Stay: Payer: Medicare Other

## 2023-10-08 ENCOUNTER — Inpatient Hospital Stay: Payer: Medicare Other | Attending: Hematology | Admitting: Hematology

## 2023-10-08 ENCOUNTER — Encounter: Payer: Self-pay | Admitting: Hematology

## 2023-10-08 VITALS — BP 140/64 | HR 91 | Temp 97.7°F | Resp 19 | Ht 65.0 in | Wt 215.8 lb

## 2023-10-08 DIAGNOSIS — D508 Other iron deficiency anemias: Secondary | ICD-10-CM

## 2023-10-08 DIAGNOSIS — J449 Chronic obstructive pulmonary disease, unspecified: Secondary | ICD-10-CM | POA: Diagnosis not present

## 2023-10-08 DIAGNOSIS — C3490 Malignant neoplasm of unspecified part of unspecified bronchus or lung: Secondary | ICD-10-CM

## 2023-10-08 DIAGNOSIS — M1990 Unspecified osteoarthritis, unspecified site: Secondary | ICD-10-CM | POA: Diagnosis not present

## 2023-10-08 DIAGNOSIS — E119 Type 2 diabetes mellitus without complications: Secondary | ICD-10-CM | POA: Diagnosis not present

## 2023-10-08 DIAGNOSIS — Z122 Encounter for screening for malignant neoplasm of respiratory organs: Secondary | ICD-10-CM

## 2023-10-08 DIAGNOSIS — R1900 Intra-abdominal and pelvic swelling, mass and lump, unspecified site: Secondary | ICD-10-CM | POA: Insufficient documentation

## 2023-10-08 DIAGNOSIS — I7300 Raynaud's syndrome without gangrene: Secondary | ICD-10-CM | POA: Insufficient documentation

## 2023-10-08 DIAGNOSIS — D509 Iron deficiency anemia, unspecified: Secondary | ICD-10-CM | POA: Insufficient documentation

## 2023-10-08 DIAGNOSIS — Z87891 Personal history of nicotine dependence: Secondary | ICD-10-CM

## 2023-10-08 NOTE — Patient Instructions (Addendum)
You were seen and examined today by Dr. Ellin Saba. Dr. Ellin Saba is a hematologist, meaning that he specializes in blood abnormalities. Dr. Ellin Saba discussed your past medical history, family history of cancers/blood conditions and the events that led to you being here today.  You were referred to Dr. Ellin Saba due to iron deficiency anemia.  We will arrange for you to have an IV iron infusion here in the clinic.   Follow-up as scheduled.

## 2023-10-08 NOTE — Progress Notes (Signed)
[facility] [address], [place] [address]   Clinic Day:  10/08/2023  Referring physician: [name], MD  Patient Care Team: [name], MD as PCP - General (Internal Medicine)   ASSESSMENT & PLAN:   Assessment:  1.  Severe iron deficiency anemia: - Referral from ER for abnormal labs on 09/28/2023, hemoglobin 8.1, ferritin 6 - Denies BRBPR/melena.  No ice pica.  No prior transfusions. - EGD/colonoscopy in [place] 2024 - Cannot take iron pills due to constipation. - Received iron infusion last year and this year in [place].  2.  Social/family history: - Lives at home with her sister Disha.  She is on 2 L/min oxygen by nasal cannula 24/7. - Current active smoker, 1 pack/day started at age 20. - Mother died of lung cancer.  Maternal aunt had lung cancer.  Maternal grandmother had liver cancer.  Plan:  1.  Severe iron deficiency anemia: - We reviewed labs from recent ER visit. - We discussed parenteral iron therapy with Monoferric 1 g IV x 1.  We discussed rare chance of anaphylactic reaction.  Due to her multiple allergies, we will premedicate her prior to Monoferric. - Will schedule her for follow-up visit in 3 months with repeat CBC, ferritin and iron panel.  2.  Right lung mass: - She reports that she had right typical lung mass in 2018.  PET scan at that time showed hypermetabolic lung mass with mediastinal, right hilar and right supraclavicular nodal disease.  However repeat CT scan few months later reportedly showed resolution of the right apical mass but she is being followed for lung nodules. - We will schedule her for CT scan of the chest prior to next visit in March.   Orders Placed This Encounter  Procedures   CT CHEST W CONTRAST    Standing Status:   Future    Expected Date:   01/06/2024    Expiration Date:   10/07/2024    If indicated for the ordered procedure, I authorize the administration of contrast media  per Radiology protocol:   Yes    Does the patient have a contrast media/X-ray dye allergy?:   No    Preferred imaging location?:   [facility]    Release to patient:   Immediate   CBC with Differential    Standing Status:   Future    Expected Date:   12/30/2023    Expiration Date:   10/07/2024   Comprehensive metabolic panel    Standing Status:   Future    Expected Date:   12/30/2023    Expiration Date:   10/07/2024   Iron and TIBC ([facility] ONLY)    Standing Status:   Future    Expected Date:   12/30/2023    Expiration Date:   10/07/2024   Ferritin    Standing Status:   Future    Expected Date:   12/30/2023    Expiration Date:   10/07/2024      Mikeal Hawthorne R Teague,acting as a scribe for Doreatha Massed, MD.,have documented all relevant documentation on the behalf of Doreatha Massed, MD,as directed by  Doreatha Massed, MD while in the presence of Doreatha Massed, MD.   I, Doreatha Massed MD, have reviewed the above documentation for accuracy and completeness, and I agree with the above.   Doreatha Massed, MD   12/18/20244:51 PM  CHIEF COMPLAINT/PURPOSE OF CONSULT:   Diagnosis: Severe iron deficiency anemia  Current Therapy: Monoferric  HISTORY OF PRESENT ILLNESS:   Carmen Hansen is a 72 y.o. female presenting to clinic today for evaluation of anemia at the request of Jacalyn Lefevre, MD.  She has a history of lupus, anemia, adenocarcinoma of the right lung, COPD (on 2-3 L of oxygen at home), DM type 2, arthritis, and Raynaud's disease. Patient was seen in the ED at [facility] on 08/25/23 for anemia, with her HGB at 9.1, and bilateral lower extremity edema. She was seen again in the ED at [facility] on 09/28/23 for SOB and peripheral edema after moving here a week prior. Imaging showed lower lung volumes with probable retrocardiac atelectasis. She was given IV lasix and discharged with oral Lasix 20 mg OD. She has an appointment with her  PCP, Dr. Vernell Leep, in February 2025.   Her most recent CBC from 09/28/23 found low RBC at 3.64, low HGB at 8.1, and low MCV at 78.3. Ferritin and Iron panel from that day found low iron at 20, elevated TIBC at 474, low saturation at 4%, and low ferritin at 6.   Today, she states that she is doing well overall. Her appetite level is at 50%. Her energy level is at 50%.    PAST MEDICAL HISTORY:   Past Medical History: Past Medical History:  Diagnosis Date   Arthritis    Cancer (HCC)    Lupus    Migraines     Surgical History: Past Surgical History:  Procedure Laterality Date   ABDOMINAL HYSTERECTOMY     CHOLECYSTECTOMY      Social History: Social History   Socioeconomic History   Marital status: Widowed    Spouse name: Not on file   Number of children: Not on file   Years of education: Not on file   Highest education level: Not on file  Occupational History   Not on file  Tobacco Use   Smoking status: Every Day    Current packs/day: 1.00    Average packs/day: 1 pack/day for 54.0 years (54.0 ttl pk-yrs)    Types: Cigarettes    Start date: 27   Smokeless tobacco: Never  Substance and Sexual Activity   Alcohol use: No   Drug use: No   Sexual activity: Not on file  Other Topics Concern   Not on file  Social History Narrative   Not on file   Social Drivers of Health   Financial Resource Strain: Medium Risk (06/05/2023)   Received from [facility] [place]   Overall Financial Resource Strain (CARDIA)    Difficulty of Paying Living Expenses: Somewhat hard  Food Insecurity: No Food Insecurity (06/05/2023)   Received from [facility]   Hunger Vital Sign    Worried About Running Out of Food in the Last Year: Never true    Ran Out of Food in the Last Year: Never true  Transportation Needs: No Transportation Needs (06/05/2023)   Received from [facility] [place]   PRAPARE - Administrator, Civil Service  (Medical): No    Lack of Transportation (Non-Medical): No  Physical Activity: Unknown (06/05/2023)   Received from [facility]   Exercise Vital Sign    Days of Exercise per Week: 0 days    Minutes of Exercise per Session: Patient declined  Stress: Stress Concern Present (06/05/2023)   Received from [facility]   [employer] of Occupational Health - Occupational Stress Questionnaire    Feeling of Stress :  To some extent  Social Connections: Socially Isolated (06/05/2023)   Received from [facility]   Social Connection and Isolation Panel [NHANES]    Frequency of Communication with Friends and Family: More than three times a week    Frequency of Social Gatherings with Friends and Family: More than three times a week    Attends Religious Services: Never    Database administrator or Organizations: No    Attends Banker Meetings: Never    Marital Status: Widowed  Intimate Partner Violence: Not At Risk (06/05/2023)   Received from [facility]   Humiliation, Afraid, Rape, and Kick questionnaire    Fear of Current or Ex-Partner: No    Emotionally Abused: No    Physically Abused: No    Sexually Abused: No    Family History: History reviewed. No pertinent family history.  Current Medications:  Current Outpatient Medications:    albuterol (PROAIR HFA) 108 (90 Base) MCG/ACT inhaler, Inhale 1-2 puffs into the lungs every 6 (six) hours as needed for wheezing or shortness of breath. , Disp: , Rfl:    benzonatate (TESSALON) 100 MG capsule, Take 1 capsule (100 mg total) by mouth every 8 (eight) hours., Disp: 21 capsule, Rfl: 0   BIOTIN PO, Take 1 tablet by mouth daily., Disp: , Rfl:    budesonide-formoterol (SYMBICORT) 80-4.5 MCG/ACT inhaler, Inhale 2 puffs into the lungs 2 (two) times daily., Disp: , Rfl:    cetirizine (ZYRTEC) 10 MG tablet, Take 10 mg by mouth daily., Disp: , Rfl:     Cholecalciferol (VITAMIN D3) 5000 units CAPS, Take 1 capsule by mouth daily., Disp: , Rfl:    cyclobenzaprine (FLEXERIL) 5 MG tablet, Take 5 mg by mouth at bedtime., Disp: , Rfl:    dexlansoprazole (DEXILANT) 60 MG capsule, Take 60 mg by mouth daily., Disp: , Rfl:    docusate sodium (COLACE) 100 MG capsule, Take 200 mg by mouth daily., Disp: , Rfl:    DULoxetine (CYMBALTA) 60 MG capsule, Take 60 mg by mouth daily., Disp: , Rfl:    ferrous sulfate 325 (65 FE) MG tablet, Take 1 tablet (325 mg total) by mouth daily., Disp: 30 tablet, Rfl: 0   folic acid (FOLVITE) 1 MG tablet, Take 1 mg by mouth daily., Disp: , Rfl:    furosemide (LASIX) 20 MG tablet, Take 1 tablet (20 mg total) by mouth daily., Disp: 30 tablet, Rfl: 0   gabapentin (NEURONTIN) 800 MG tablet, Take 800 mg by mouth 4 (four) times daily., Disp: , Rfl:    hydroxychloroquine (PLAQUENIL) 200 MG tablet, Take 200 mg by mouth 2 (two) times daily., Disp: , Rfl:    ipratropium-albuterol (DUONEB) 0.5-2.5 (3) MG/3ML SOLN, Take 3 mLs by nebulization 4 (four) times daily., Disp: , Rfl:    montelukast (SINGULAIR) 10 MG tablet, Take 10 mg by mouth at bedtime., Disp: , Rfl:    predniSONE (DELTASONE) 5 MG tablet, Take 5 mg by mouth daily with breakfast., Disp: , Rfl:    rizatriptan (MAXALT) 10 MG tablet, Take 10 mg by mouth as needed for migraine. , Disp: , Rfl:    SPIRIVA RESPIMAT 2.5 MCG/ACT AERS, Inhale 1 spray into the lungs daily., Disp: , Rfl:    topiramate (TOPAMAX) 50 MG tablet, Take 50 mg by mouth 2 (two) times daily., Disp: , Rfl:    traMADol (ULTRAM) 50 MG tablet, Take 50 mg by mouth every 6 (six) hours as needed for moderate pain.,  Disp: , Rfl:    zolpidem (AMBIEN) 10 MG tablet, Take 10 mg by mouth at bedtime., Disp: , Rfl:    Allergies: Allergies  Allergen Reactions   Celecoxib Hives   Hydrocodone-Acetaminophen Itching    Other Reaction(s): analgesics-opioid   Oxycodone-Acetaminophen     Other Reaction(s): analgesics-opioid  rash    Roflumilast Itching    Other Reaction(s): antiasthmatic and bronchodilator agents   Sulfamethoxazole     Other Reaction(s): Allergy to sulfonamides  itching,hives   Sulfamethoxazole-Trimethoprim     Other Reaction(s): anti-infective agents-misc   Tramadol     itching   Leflunomide Itching and Rash    Other Reaction(s): analgesics-anti inflammatory  itching   Sulfa Antibiotics Rash   Sulfasalazine Rash    REVIEW OF SYSTEMS:   Review of Systems  Constitutional:  Negative for chills, fatigue and fever.  HENT:   Negative for lump/mass, mouth sores, nosebleeds, sore throat and trouble swallowing.   Eyes:  Negative for eye problems.  Respiratory:  Positive for cough and shortness of breath.   Cardiovascular:  Positive for palpitations. Negative for chest pain and leg swelling.  Gastrointestinal:  Positive for nausea and vomiting. Negative for abdominal pain, constipation and diarrhea.  Genitourinary:  Negative for bladder incontinence, difficulty urinating, dysuria, frequency, hematuria and nocturia.   Musculoskeletal:  Negative for arthralgias, back pain, flank pain, myalgias and neck pain.  Skin:  Negative for itching and rash.  Neurological:  Positive for headaches and numbness. Negative for dizziness.  Hematological:  Does not bruise/bleed easily.  Psychiatric/Behavioral:  Positive for sleep disturbance. Negative for depression and suicidal ideas. The patient is not nervous/anxious.   All other systems reviewed and are negative.    VITALS:   Blood pressure (!) 140/64, pulse 91, temperature 97.7 F (36.5 C), temperature source Tympanic, resp. rate 19, height 5\' 5"  (1.651 m), weight 215 lb 12.8 oz (97.9 kg), SpO2 99%.  Wt Readings from Last 3 Encounters:  10/08/23 215 lb 12.8 oz (97.9 kg)  09/28/23 221 lb (100.2 kg)  09/23/17 196 lb (88.9 kg)    Body mass index is 35.91 kg/m.   PHYSICAL EXAM:   Physical Exam Vitals and nursing note reviewed. Exam conducted with a  chaperone present.  Constitutional:      Appearance: Normal appearance.  Cardiovascular:     Rate and Rhythm: Normal rate and regular rhythm.     Pulses: Normal pulses.     Heart sounds: Normal heart sounds.  Pulmonary:     Effort: Pulmonary effort is normal.     Breath sounds: Normal breath sounds.  Abdominal:     Palpations: Abdomen is soft. There is no hepatomegaly, splenomegaly or mass.     Tenderness: There is no abdominal tenderness.  Musculoskeletal:     Right lower leg: No edema.     Left lower leg: No edema.  Lymphadenopathy:     Cervical: No cervical adenopathy.     Right cervical: No superficial, deep or posterior cervical adenopathy.    Left cervical: No superficial, deep or posterior cervical adenopathy.     Upper Body:     Right upper body: No supraclavicular or axillary adenopathy.     Left upper body: No supraclavicular or axillary adenopathy.  Neurological:     General: No focal deficit present.     Mental Status: She is alert and oriented to person, place, and time.  Psychiatric:        Mood and Affect: Mood normal.  Behavior: Behavior normal.     LABS:      Latest Ref Rng & Units 09/28/2023    2:35 PM 09/23/2017    4:44 PM  CBC  WBC 4.0 - 10.5 K/uL 6.1  5.4   Hemoglobin 12.0 - 15.0 g/dL 8.1  16.1   Hematocrit 36.0 - 46.0 % 28.5  43.2   Platelets 150 - 400 K/uL 250  159       Latest Ref Rng & Units 09/28/2023    2:35 PM 09/23/2017    4:44 PM  CMP  Glucose 70 - 99 mg/dL 98  73   BUN 8 - 23 mg/dL 12  15   Creatinine 0.96 - 1.00 mg/dL 0.45  4.09   Sodium 811 - 145 mmol/L 136  135   Potassium 3.5 - 5.1 mmol/L 3.8  3.1   Chloride 98 - 111 mmol/L 102  101   CO2 22 - 32 mmol/L 25  23   Calcium 8.9 - 10.3 mg/dL 8.9  8.1   Total Protein 6.5 - 8.1 g/dL 6.8  7.2   Total Bilirubin <1.2 mg/dL 0.5  0.6   Alkaline Phos 38 - 126 U/L 75  56   AST 15 - 41 U/L 19  53   ALT 0 - 44 U/L 16  26      No results found for: "CEA1", "CEA" / No results found  for: "CEA1", "CEA" No results found for: "PSA1" No results found for: "BJY782" No results found for: "CAN125"  No results found for: "TOTALPROTELP", "ALBUMINELP", "A1GS", "A2GS", "BETS", "BETA2SER", "GAMS", "MSPIKE", "SPEI" Lab Results  Component Value Date   TIBC 474 (H) 09/28/2023   FERRITIN 6 (L) 09/28/2023   IRONPCTSAT 4 (L) 09/28/2023   No results found for: "LDH"   STUDIES:   DG Chest Portable 1 View Result Date: 09/28/2023 CLINICAL DATA:  Shortness of breath. On home oxygen. Lower extremity and abdominal swelling. EXAM: PORTABLE CHEST 1 VIEW COMPARISON:  Radiographs 09/23/2017. FINDINGS: 1438 hours. Lower lung volumes. The heart size and mediastinal contours are stable. Mildly increased retrocardiac opacity, probably reflecting atelectasis. No confluent airspace disease, definite edema, pleural effusion or pneumothorax. Patient has undergone interval lower cervical fusion and midthoracic spinal augmentation. No acute osseous findings are evident. Telemetry leads overlie the chest. IMPRESSION: Lower lung volumes with probable retrocardiac atelectasis. No definite acute cardiopulmonary process. Electronically Signed   By: Carey Bullocks M.D.   On: 09/28/2023 14:54

## 2023-10-13 ENCOUNTER — Telehealth: Payer: Self-pay | Admitting: Internal Medicine

## 2023-10-13 ENCOUNTER — Inpatient Hospital Stay: Payer: Medicare Other

## 2023-10-13 VITALS — BP 139/65 | HR 70 | Temp 97.6°F | Resp 18

## 2023-10-13 DIAGNOSIS — D508 Other iron deficiency anemias: Secondary | ICD-10-CM

## 2023-10-13 DIAGNOSIS — D509 Iron deficiency anemia, unspecified: Secondary | ICD-10-CM | POA: Diagnosis not present

## 2023-10-13 MED ORDER — CETIRIZINE HCL 10 MG/ML IV SOLN
10.0000 mg | Freq: Once | INTRAVENOUS | Status: AC
  Administered 2023-10-13: 10 mg via INTRAVENOUS
  Filled 2023-10-13: qty 1

## 2023-10-13 MED ORDER — METHYLPREDNISOLONE SODIUM SUCC 125 MG IJ SOLR
125.0000 mg | Freq: Once | INTRAMUSCULAR | Status: AC
Start: 2023-10-13 — End: 2023-10-13
  Administered 2023-10-13: 125 mg via INTRAVENOUS
  Filled 2023-10-13: qty 2

## 2023-10-13 MED ORDER — SODIUM CHLORIDE 0.9 % IV SOLN: INTRAVENOUS | Status: AC

## 2023-10-13 MED ORDER — SODIUM CHLORIDE 0.9 % IV SOLN
1000.0000 mg | Freq: Once | INTRAVENOUS | Status: AC
  Administered 2023-10-13: 1000 mg via INTRAVENOUS
  Filled 2023-10-13: qty 1000

## 2023-10-13 MED ORDER — FAMOTIDINE IN NACL 20-0.9 MG/50ML-% IV SOLN
20.0000 mg | Freq: Once | INTRAVENOUS | Status: AC
Start: 2023-10-13 — End: 2023-10-13
  Administered 2023-10-13: 20 mg via INTRAVENOUS
  Filled 2023-10-13: qty 50

## 2023-10-13 NOTE — Patient Instructions (Signed)
 [facility] [place] - A DEPT OF [facility]  Discharge Instructions: Thank you for choosing [facility] to provide your oncology and hematology care.  If you have a lab appointment with the Cancer Center - please note that after April 8th, 2024, all labs will be drawn in the cancer center.  You do not have to check in or register with the main entrance as you have in the past but will complete your check-in in the cancer center.  Wear comfortable clothing and clothing appropriate for easy access to any Portacath or PICC line.   We strive to give you quality time with your provider. You may need to reschedule your appointment if you arrive late (15 or more minutes).  Arriving late affects you and other patients whose appointments are after yours.  Also, if you miss three or more appointments without notifying the office, you may be dismissed from the clinic at the provider's discretion.      For prescription refill requests, have your pharmacy contact our office and allow 72 hours for refills to be completed.    Today you received the following chemotherapy and/or immunotherapy agents Monoferric      To help prevent nausea and vomiting after your treatment, we encourage you to take your nausea medication as directed.  BELOW ARE SYMPTOMS THAT SHOULD BE REPORTED IMMEDIATELY: *FEVER GREATER THAN 100.4 F (38 C) OR HIGHER *CHILLS OR SWEATING *NAUSEA AND VOMITING THAT IS NOT CONTROLLED WITH YOUR NAUSEA MEDICATION *UNUSUAL SHORTNESS OF BREATH *UNUSUAL BRUISING OR BLEEDING *URINARY PROBLEMS (pain or burning when urinating, or frequent urination) *BOWEL PROBLEMS (unusual diarrhea, constipation, pain near the anus) TENDERNESS IN MOUTH AND THROAT WITH OR WITHOUT PRESENCE OF ULCERS (sore throat, sores in mouth, or a toothache) UNUSUAL RASH, SWELLING OR PAIN  UNUSUAL VAGINAL DISCHARGE OR ITCHING   Items with * indicate a potential emergency and should be followed  up as soon as possible or go to the Emergency Department if any problems should occur.  Please show the CHEMOTHERAPY ALERT CARD or IMMUNOTHERAPY ALERT CARD at check-in to the Emergency Department and triage nurse.  Should you have questions after your visit or need to cancel or reschedule your appointment, please contact [facility] [place] - A DEPT OF Eligha [facility] [phone]  and follow the prompts.  Office hours are 8:00 a.m. to 4:30 p.m. Monday - Friday. Please note that voicemails left after 4:00 p.m. may not be returned until the following business day.  We are closed weekends and major holidays. You have access to a nurse at all times for urgent questions. Please call the main number to the clinic [phone] and follow the prompts.  For any non-urgent questions, you may also contact your provider using MyChart. We now offer e-Visits for anyone 54 and older to request care online for non-urgent symptoms. For details visit [email or web address].   Also download the MyChart app! Go to the app store, search "MyChart", open the app, select [place], and log in with your MyChart username and password.

## 2023-10-13 NOTE — Progress Notes (Unsigned)
Patient presents today for Monoferric infusion per providers order.  Vital signs WNL.  Patient has no new complaints at this time.    Peripheral IV started and blood return noted pre and post infusion.  Stable during infusion without adverse affects.  Vital signs stable.  No complaints at this time.  Discharge from clinic ambulatory in stable condition.  Alert and oriented X 3.  Follow up with [facility] as scheduled.

## 2023-10-13 NOTE — Telephone Encounter (Signed)
Spoke with patient regarding new appointment date and time  Tuesday 11/25/23 at 3:00pm---with Dr. Sydnee Levans mail information to patient and she voiced her understanding.

## 2023-10-23 ENCOUNTER — Emergency Department (HOSPITAL_COMMUNITY)
Admission: EM | Admit: 2023-10-23 | Discharge: 2023-10-23 | Disposition: A | Discharge status: Home / Self Care | Payer: Medicare Other | Attending: Emergency Medicine | Admitting: Emergency Medicine

## 2023-10-23 ENCOUNTER — Emergency Department (HOSPITAL_COMMUNITY): Payer: Medicare Other

## 2023-10-23 ENCOUNTER — Other Ambulatory Visit: Payer: Self-pay

## 2023-10-23 ENCOUNTER — Encounter (HOSPITAL_COMMUNITY): Payer: Self-pay | Admitting: Emergency Medicine

## 2023-10-23 DIAGNOSIS — R600 Localized edema: Secondary | ICD-10-CM | POA: Insufficient documentation

## 2023-10-23 DIAGNOSIS — M79604 Pain in right leg: Secondary | ICD-10-CM

## 2023-10-23 LAB — CBC WITH DIFFERENTIAL/PLATELET
Abs Immature Granulocytes: 0.04 10*3/uL (ref 0.00–0.07)
Basophils Absolute: 0 10*3/uL (ref 0.0–0.1)
Basophils Relative: 1 %
Eosinophils Absolute: 0.1 10*3/uL (ref 0.0–0.5)
Eosinophils Relative: 1 %
HCT: 37.3 % (ref 36.0–46.0)
Hemoglobin: 10.3 g/dL — ABNORMAL LOW (ref 12.0–15.0)
Immature Granulocytes: 1 %
Lymphocytes Relative: 23 %
Lymphs Abs: 1.5 10*3/uL (ref 0.7–4.0)
MCH: 24 pg — ABNORMAL LOW (ref 26.0–34.0)
MCHC: 27.6 g/dL — ABNORMAL LOW (ref 30.0–36.0)
MCV: 86.7 fL (ref 80.0–100.0)
Monocytes Absolute: 0.7 10*3/uL (ref 0.1–1.0)
Monocytes Relative: 10 %
Neutro Abs: 4.5 10*3/uL (ref 1.7–7.7)
Neutrophils Relative %: 64 %
Platelets: 175 10*3/uL (ref 150–400)
RBC: 4.3 MIL/uL (ref 3.87–5.11)
RDW: 28.1 % — ABNORMAL HIGH (ref 11.5–15.5)
Smear Review: ADEQUATE
WBC: 6.8 10*3/uL (ref 4.0–10.5)
nRBC: 0 % (ref 0.0–0.2)

## 2023-10-23 LAB — BASIC METABOLIC PANEL
Anion gap: 9 (ref 5–15)
BUN: 18 mg/dL (ref 8–23)
CO2: 26 mmol/L (ref 22–32)
Calcium: 8.4 mg/dL — ABNORMAL LOW (ref 8.9–10.3)
Chloride: 100 mmol/L (ref 98–111)
Creatinine, Ser: 0.92 mg/dL (ref 0.44–1.00)
GFR, Estimated: >60 mL/min (ref >60)
Glucose, Bld: 101 mg/dL — ABNORMAL HIGH (ref 70–99)
Potassium: 4 mmol/L (ref 3.5–5.1)
Sodium: 135 mmol/L (ref 135–145)

## 2023-10-23 LAB — CK: Total CK: 28 U/L — ABNORMAL LOW (ref 38–234)

## 2023-10-23 LAB — PROTIME-INR
INR: 1 (ref 0.8–1.2)
Prothrombin Time: 13.1 s (ref 11.4–15.2)

## 2023-10-23 MED ORDER — FENTANYL CITRATE PF 50 MCG/ML IJ SOSY
50.0000 ug | PREFILLED_SYRINGE | Freq: Once | INTRAMUSCULAR | Status: AC
  Administered 2023-10-23: 50 ug via INTRAVENOUS
  Filled 2023-10-23: qty 1

## 2023-10-23 MED ORDER — OXYCODONE-ACETAMINOPHEN 5-325 MG PO TABS: 1.0000 | ORAL_TABLET | Freq: Four times a day (QID) | ORAL | 0 refills | Status: DC | PRN

## 2023-10-23 NOTE — ED Triage Notes (Signed)
 Pt c/o anterior right leg pain x 1 week from groin down to ankle. Nad. Took pain pill with no relief. Denies injury.

## 2023-10-23 NOTE — Discharge Instructions (Signed)
 You are seen in the emergency department for right leg pain.  You had x-rays of your hip and knee along with an ultrasound that did not show any evidence of blood clot.  We are prescribing a short course of pain medicine.  You may also benefit from increasing your prednisone to 10 mg.  Please follow-up with your primary care doctor.  Return to the emergency department if any worsening or concerning symptoms

## 2023-10-23 NOTE — ED Provider Notes (Signed)
 [place] EMERGENCY DEPARTMENT AT [facility] Provider Note   CSN: [id number] Arrival date & time: 10/23/23  1107     History  Chief Complaint  Patient presents with   Leg Pain    Carmen Hansen is a 73 y.o. female.  She is here with a complaint of right leg pain for 1 week.  She said it started in her knee but traveled to her hip and now radiates all the way down to her foot.  No numbness or weakness.  She has some pain at rest but it is much worse with any type of movement.  She tried muscle relaxants and hydrocodone with minimal relief.  She does not have a primary care doctor.  She recently moved back to the area from [place] .  The history is provided by the patient.  Leg Pain Location:  Leg Time since incident:  1 week Injury: no   Leg location:  R leg Pain details:    Quality:  Aching and sharp   Severity:  Severe   Onset quality:  Gradual   Timing:  Constant   Progression:  Worsening Chronicity:  New Relieved by:  Nothing Worsened by:  Bearing weight Ineffective treatments:  Muscle relaxant Associated symptoms: no back pain, no decreased ROM, no fever, no numbness and no tingling        Home Medications Prior to Admission medications   Medication Sig Start Date End Date Taking? Authorizing Provider  albuterol  (PROAIR  HFA) 108 (90 Base) MCG/ACT inhaler Inhale 1-2 puffs into the lungs every 6 (six) hours as needed for wheezing or shortness of breath.  03/10/10   [provider]  benzonatate  (TESSALON ) 100 MG capsule Take 1 capsule (100 mg total) by mouth every 8 (eight) hours. 09/23/17   Josem Bring, MD  BIOTIN PO Take 1 tablet by mouth daily.    [provider]  budesonide-formoterol (SYMBICORT) 80-4.5 MCG/ACT inhaler Inhale 2 puffs into the lungs 2 (two) times daily.    [provider]  cetirizine  (ZYRTEC ) 10 MG tablet Take 10 mg by mouth daily.    [provider]  Cholecalciferol (VITAMIN D3) 5000 units CAPS Take 1  capsule by mouth daily.    [provider]  cyclobenzaprine (FLEXERIL) 5 MG tablet Take 5 mg by mouth at bedtime.    [provider]  dexlansoprazole (DEXILANT) 60 MG capsule Take 60 mg by mouth daily. 03/10/10   [provider]  docusate sodium (COLACE) 100 MG capsule Take 200 mg by mouth daily.    [provider]  DULoxetine (CYMBALTA) 60 MG capsule Take 60 mg by mouth daily. 04/03/10   [provider]  ferrous sulfate  325 (65 FE) MG tablet Take 1 tablet (325 mg total) by mouth daily. 09/28/23   Dean Clarity, MD  folic acid  (FOLVITE ) 1 MG tablet Take 1 mg by mouth daily. 03/09/10   [provider]  furosemide  (LASIX ) 20 MG tablet Take 1 tablet (20 mg total) by mouth daily. 09/28/23   Haviland, Julie, MD  gabapentin (NEURONTIN) 800 MG tablet Take 800 mg by mouth 4 (four) times daily. 07/29/17   [provider]  hydroxychloroquine (PLAQUENIL) 200 MG tablet Take 200 mg by mouth 2 (two) times daily. 01/10/10   [provider]  ipratropium-albuterol  (DUONEB) 0.5-2.5 (3) MG/3ML SOLN Take 3 mLs by nebulization 4 (four) times daily.    [provider]  montelukast (SINGULAIR) 10 MG tablet Take 10 mg by mouth at bedtime. 09/18/17  [provider]  predniSONE (DELTASONE) 5 MG tablet Take 5 mg by mouth daily with breakfast.    [provider]  rizatriptan (MAXALT) 10 MG tablet Take 10 mg by mouth as needed for migraine.  12/21/09   [provider]  SPIRIVA RESPIMAT 2.5 MCG/ACT AERS Inhale 1 spray into the lungs daily. 09/01/17   [provider]  topiramate (TOPAMAX) 50 MG tablet Take 50 mg by mouth 2 (two) times daily.    [provider]  traMADol (ULTRAM) 50 MG tablet Take 50 mg by mouth every 6 (six) hours as needed for moderate pain.    [provider]  zolpidem (AMBIEN) 10 MG tablet Take 10 mg by mouth at bedtime. 03/24/10   [provider]      Allergies     Celecoxib, Hydrocodone-acetaminophen , Oxycodone -acetaminophen , Roflumilast, Sulfamethoxazole, Sulfamethoxazole-trimethoprim, Tramadol, Leflunomide, Sulfa antibiotics, and Sulfasalazine    Review of Systems   Review of Systems  Constitutional:  Negative for fever.  Respiratory:  Negative for shortness of breath.   Cardiovascular:  Negative for chest pain.  Genitourinary:  Negative for dysuria.  Musculoskeletal:  Negative for back pain.    Physical Exam Updated Vital Signs BP (!) 155/70 (BP Location: Left Arm)   Pulse 80   Temp 97.9 F (36.6 C) (Oral)   Resp 18   SpO2 93%  Physical Exam Vitals and nursing note reviewed.  Constitutional:      General: She is not in acute distress.    Appearance: Normal appearance. She is well-developed.  HENT:     Head: Normocephalic and atraumatic.  Eyes:     Conjunctiva/sclera: Conjunctivae normal.  Cardiovascular:     Rate and Rhythm: Normal rate and regular rhythm.     Heart sounds: No murmur heard. Pulmonary:     Effort: Pulmonary effort is normal. No respiratory distress.     Breath sounds: Normal breath sounds.  Abdominal:     Palpations: Abdomen is soft.     Tenderness: There is no abdominal tenderness.  Musculoskeletal:        General: No deformity. Normal range of motion.     Cervical back: Neck supple.     Right lower leg: Edema present.     Left lower leg: Edema present.  Skin:    General: Skin is warm and dry.     Capillary Refill: Capillary refill takes less than 2 seconds.  Neurological:     General: No focal deficit present.     Mental Status: She is alert.     Sensory: No sensory deficit.     Motor: No weakness.     ED Results / Procedures / Treatments   Labs (all labs ordered are listed, but only abnormal results are displayed) Labs Reviewed  BASIC METABOLIC PANEL - Abnormal; Notable for the following components:      Result Value   Glucose, Bld 101 (*)    Calcium 8.4 (*)    All other components within  normal limits  CBC WITH DIFFERENTIAL/PLATELET - Abnormal; Notable for the following components:   Hemoglobin 10.3 (*)    MCH 24.0 (*)    MCHC 27.6 (*)    RDW 28.1 (*)    All other components within normal limits  CK - Abnormal; Notable for the following components:   Total CK 28 (*)    All other components within normal limits  PROTIME-INR    EKG None  Radiology DG Knee Complete 4 Views Right Result Date: 10/23/2023  CLINICAL DATA:  Anterior leg pain for 1 week from groin down to ankle. No known injury. EXAM: RIGHT KNEE - COMPLETE 4+ VIEW COMPARISON:  None Available. FINDINGS: Mild chronic enthesopathic change at the quadriceps insertion on the patella. Tiny joint effusion. Joint spaces are preserved. No acute fracture or dislocation. IMPRESSION: No significant osteoarthritis. Electronically Signed   By: Tanda Lyons M.D.   On: 10/23/2023 15:48   DG Hip Unilat With Pelvis 2-3 Views Right Result Date: 10/23/2023 CLINICAL DATA:  Anterior right leg pain for 1 week from groin down to ankle. EXAM: DG HIP (WITH OR WITHOUT PELVIS) 2-3V RIGHT COMPARISON:  None Available. FINDINGS: There is diffuse decreased bone mineralization. Mild bilateral sacroiliac subchondral sclerosis. Mild bilateral superior femoroacetabular joint space narrowing and superolateral acetabular degenerative osteophytosis. The pubic symphysis joint space is maintained. Mild convexity of the anterior right femoral head-neck junction as can be seen with CAM-type femoroacetabular impingement. No acute fracture or dislocation. Vascular phleboliths overlie the pelvis. L4 and L5 vertebral body augmentation cement is noted. IMPRESSION: 1. Mild bilateral femoroacetabular osteoarthritis. 2. Mild convexity of the anterior right femoral head-neck junction as can be seen with CAM-type femoroacetabular impingement. Electronically Signed   By: Tanda Lyons M.D.   On: 10/23/2023 15:44   US  Venous Img Lower Right (DVT Study) Result Date:  10/23/2023 CLINICAL DATA:  Right lower extremity pain and edema. EXAM: RIGHT LOWER EXTREMITY VENOUS DOPPLER ULTRASOUND TECHNIQUE: Gray-scale sonography with graded compression, as well as color Doppler and duplex ultrasound were performed to evaluate the lower extremity deep venous systems from the level of the common femoral vein and including the common femoral, femoral, profunda femoral, popliteal and calf veins including the posterior tibial, peroneal and gastrocnemius veins when visible. The superficial great saphenous vein was also interrogated. Spectral Doppler was utilized to evaluate flow at rest and with distal augmentation maneuvers in the common femoral, femoral and popliteal veins. COMPARISON:  None Available. FINDINGS: Contralateral Common Femoral Vein: Respiratory phasicity is normal and symmetric with the symptomatic side. No evidence of thrombus. Normal compressibility. Common Femoral Vein: No evidence of thrombus. Normal compressibility, respiratory phasicity and response to augmentation. Saphenofemoral Junction: No evidence of thrombus. Normal compressibility and flow on color Doppler imaging. Profunda Femoral Vein: No evidence of thrombus. Normal compressibility and flow on color Doppler imaging. Femoral Vein: No evidence of thrombus. Normal compressibility, respiratory phasicity and response to augmentation. Popliteal Vein: No evidence of thrombus. Normal compressibility, respiratory phasicity and response to augmentation. Calf Veins: Limited evaluation calf veins but no thrombus identified. Superficial Great Saphenous Vein: No evidence of thrombus. Normal compressibility. Venous Reflux:  None. Other Findings: No evidence of superficial thrombophlebitis or abnormal fluid collection. IMPRESSION: No evidence of right lower extremity deep venous thrombosis. Electronically Signed   By: Marcey Moan M.D.   On: 10/23/2023 15:09    Procedures Procedures    Medications Ordered in  ED Medications  fentaNYL  (SUBLIMAZE ) injection 50 mcg (50 mcg Intravenous Given 10/23/23 1427)    ED Course/ Medical Decision Making/ A&P                                 Medical Decision Making Amount and/or Complexity of Data Reviewed Labs: ordered. Radiology: ordered.  Risk Prescription drug management.   This patient complains of right leg pain; this involves an extensive number of treatment Options and is a complaint that carries with it a high risk of complications  and morbidity. The differential includes arthritis, DVT, cellulitis, peripheral vascular disease, myositis, septic joint  I ordered, reviewed and interpreted labs, which included CBC with normal white count stable low hemoglobin, chemistries unremarkable, CK normal I ordered medication IV pain medication and reviewed PMP when indicated. I ordered imaging studies which included x-rays of hip and knee, ultrasound of the leg and I independently    visualized and interpreted imaging which showed no acute findings Additional history obtained from patient's family member Previous records obtained and reviewed in epic, patient was here 3 weeks ago for peripheral edema Cardiac monitoring reviewed, normal sinus rhythm Social determinants considered, patient with tobacco use stressors and social isolation Critical Interventions: None  After the interventions stated above, I reevaluated the patient and found patient to be neurovascularly intact Admission and further testing considered, she is asking when she can be discharged.  I do not see any obvious indication for her to be admitted at this time.  Will cover her with pain medication and asked her to increase her steroid short-term and arrange follow-up with PCP.  Return instructions discussed         Final Clinical Impression(s) / ED Diagnoses Final diagnoses:  Right leg pain    Rx / DC Orders ED Discharge Orders          Ordered    oxyCODONE -acetaminophen   (PERCOCET/ROXICET) 5-325 MG tablet  Every 6 hours PRN        10/23/23 1552              Towana Ozell BROCKS, MD 10/23/23 2023

## 2023-10-30 ENCOUNTER — Institutional Professional Consult (permissible substitution): Payer: Medicare Other | Admitting: Internal Medicine

## 2023-11-03 DIAGNOSIS — F329 Major depressive disorder, single episode, unspecified: Secondary | ICD-10-CM | POA: Insufficient documentation

## 2023-11-03 DIAGNOSIS — J4540 Moderate persistent asthma, uncomplicated: Secondary | ICD-10-CM | POA: Insufficient documentation

## 2023-11-03 DIAGNOSIS — I2699 Other pulmonary embolism without acute cor pulmonale: Secondary | ICD-10-CM | POA: Insufficient documentation

## 2023-11-23 NOTE — Progress Notes (Signed)
 Carmen Hansen, female    DOB: 08-19-1951    MRN: [id number]   Brief patient profile:  50  yowf  active smoker  referred to pulmonary clinic in [place]  11/25/2023 by EDP- [facility]  for copd pred dep since 1999 / 02 dep x 2015    Discharge date: November 08, 2023 Length of stay: LOS: 5 days   Admission HPI   Patient admitted on: 11/02/2023 10:50 PM   CHIEF COMPLAINT: Fever/chills shortness of breath  Day of admission HPI:   This is a 73 y.o. female with a known history of RA, asthma/COPD on 2L home O2, HTN, HLD, lung cancer in remission, depression, anxiety, GERD, insomnia, prediabetes presents to the emergency department for evaluation of two week history of worsening SOB associated with cough and fever to 103 at home. In ER she was placed on BiPAP, was found to meet sepsis criteria and have a small PE without right heart strain. She is feeling better with BiPAP  In the ED the patient received cefepime,vancomycin, heparin, duoneb, methylpred. Medical admission was requested for further workup and management of acute on chronic hypoxic respiratory failure, PE, sepsis. She was admitted as an inpatient to the hospital service and initiated on IV heparin for her PE. Patient was also seen by general surgery for her perirectal abscess, status post I&D patient was admitted for sepsis right labial cellulitis.   She was initiated on broad-spectrum antibiotics this was de-escalated to Augmentin. Her respiratory status was closely monitored in the ICU. Patient was transition from IV heparin to Eliquis on the day of discharge she was hemodynamically stable and seen by general surgery patient is to follow-up with primary care physician and general surgery as scheduled. She is currently on Eliquis for her PE and p.o. Augmentin All acute and chronic comorbid's were addressed risk of decompensation mortality morbidity and death is moderate to high if the patient does not follow recommendations medications  follow-up appointments and compliance  Patient admitted on Home O2? -Yes Patient on home anticoagulant? -No Patient admitted with Chronic home foley catheter? - no Foley catheter placed or replaced by another service prior to admission? - no       ASSESSMENT & PLAN (In order of descending acuity) Right labial cellulitis/right perirectal abscess PE  Chronic medical problems COPD Hypertension History of lung cancer in remission Diabetes type 2      History of Present Illness  11/25/2023  Pulmonary/ 1st office eval/ Carmen Hansen / [name]  pred 2.5 mg /day x 1999/ now on Eliquis for R DVT / small PE Chief Complaint  Patient presents with   Consult    Copd   Dyspnea:  doesn't need 02 at rest, drops with walking / housebound x nov 22 25  Cough: x years sporadic  > clear  Sleep: flat bed / 2 pillows  SABA use: none  02: 2lpm hs/ up to 3lpm     No obvious day to day or daytime pattern/variability or assoc excess/ purulent sputum or mucus plugs or hemoptysis or cp or chest tightness, subjective wheeze or overt sinus or hb symptoms.    Also denies any obvious fluctuation of symptoms with weather or environmental changes or other aggravating or alleviating factors except as outlined above   No unusual exposure hx or h/o childhood pna/ asthma or knowledge of premature birth.  Current Allergies, Complete Past Medical History, Past Surgical History, Family History, and Social History were reviewed in [employer] record.  ROS  The following are not active complaints unless bolded Hoarseness, sore throat, dysphagia, dental problems, itching, sneezing,  nasal congestion or discharge of excess mucus or purulent secretions, ear ache,   fever, chills, sweats, unintended wt loss or wt gain, classically pleuritic or exertional cp,  orthopnea pnd or arm/hand swelling  or leg swelling, presyncope, palpitations, abdominal pain, anorexia, nausea, vomiting, diarrhea  or  change in bowel habits or change in bladder habits, change in stools or change in urine, dysuria, hematuria,  rash, arthralgias, visual complaints, headache, numbness, weakness or ataxia or problems with walking or coordination/ using rollator  change in mood or  memory.            Outpatient Medications Prior to Visit - - NOTE:   Unable to verify as accurately reflecting what pt takes    Medication Sig Dispense Refill   albuterol  (PROAIR  HFA) 108 (90 Base) MCG/ACT inhaler Inhale 1-2 puffs into the lungs every 6 (six) hours as needed for wheezing or shortness of breath.      benzonatate  (TESSALON ) 100 MG capsule Take 1 capsule (100 mg total) by mouth every 8 (eight) hours. 21 capsule 0   BIOTIN PO Take 1 tablet by mouth daily.     budesonide-formoterol (SYMBICORT) 80-4.5 MCG/ACT inhaler Inhale 2 puffs into the lungs 2 (two) times daily.     cetirizine  (ZYRTEC ) 10 MG tablet Take 10 mg by mouth daily.     Cholecalciferol (VITAMIN D3) 5000 units CAPS Take 1 capsule by mouth daily.     cyclobenzaprine (FLEXERIL) 5 MG tablet Take 5 mg by mouth at bedtime.     dexlansoprazole (DEXILANT) 60 MG capsule Take 60 mg by mouth daily.     docusate sodium (COLACE) 100 MG capsule Take 200 mg by mouth daily.     DULoxetine (CYMBALTA) 60 MG capsule Take 60 mg by mouth daily.     ferrous sulfate  325 (65 FE) MG tablet Take 1 tablet (325 mg total) by mouth daily. 30 tablet 0   folic acid  (FOLVITE ) 1 MG tablet Take 1 mg by mouth daily.     furosemide  (LASIX ) 20 MG tablet Take 1 tablet (20 mg total) by mouth daily. 30 tablet 0   gabapentin (NEURONTIN) 800 MG tablet Take 800 mg by mouth 4 (four) times daily.     hydroxychloroquine (PLAQUENIL) 200 MG tablet Take 200 mg by mouth 2 (two) times daily.     ipratropium-albuterol  (DUONEB) 0.5-2.5 (3) MG/3ML SOLN Take 3 mLs by nebulization 4 (four) times daily.     montelukast (SINGULAIR) 10 MG tablet Take 10 mg by mouth at bedtime.     oxyCODONE -acetaminophen   (PERCOCET/ROXICET) 5-325 MG tablet Take 1 tablet by mouth every 6 (six) hours as needed for severe pain (pain score 7-10). 15 tablet 0   predniSONE (DELTASONE) 5 MG tablet Take 5 mg by mouth daily with breakfast.     rizatriptan (MAXALT) 10 MG tablet Take 10 mg by mouth as needed for migraine.      SPIRIVA RESPIMAT 2.5 MCG/ACT AERS Says not taking      topiramate (TOPAMAX) 50 MG tablet Take 50 mg by mouth 2 (two) times daily.     traMADol (ULTRAM) 50 MG tablet Take 50 mg by mouth every 6 (six) hours as needed for moderate pain.     zolpidem (AMBIEN) 10 MG tablet Take 10 mg by mouth at bedtime.      Past Medical History:  Diagnosis Date   Arthritis  Cancer (HCC)    Lupus    Migraines       Objective:     BP 109/66   Pulse (!) 103   Ht 5' 5 (1.651 m)   Wt 202 lb 12.8 oz (92 kg)   SpO2 94% Comment: 2 L of pluse oxygen  BMI 33.75 kg/m   SpO2: 94 % (2 L of pluse oxygen)  Elderly wf ambulating  with rollator, slt congested cough = smoker's rattle   HEENT : Oropharynx  clear   Nasal turbinates nl    NECK :  without  apparent JVD/ palpable Nodes/TM    LUNGS: no acc muscle use,  Min barrel  contour chest wall with bilateral  slightly decreased bs s audible wheeze and  without cough on insp or exp maneuvers and min  Hyperresonant  to  percussion bilaterally    CV:  RRR  no s3 or murmur or increase in P2, and no edema   ABD: obese  soft and nontender    MS:  Nl gait/ ext warm without deformities Or obvious joint restrictions  calf tenderness, cyanosis or clubbing     SKIN: warm and dry without lesions    NEURO:  alert, approp, nl sensorium with  no motor or cerebellar deficits apparent.            Assessment   No problem-specific Assessment & Plan notes found for this encounter.     Ozell America, MD 11/25/2023

## 2023-11-24 ENCOUNTER — Encounter: Payer: Self-pay | Admitting: Hematology

## 2023-11-25 ENCOUNTER — Telehealth: Payer: Self-pay | Admitting: Internal Medicine

## 2023-11-25 ENCOUNTER — Encounter: Payer: Self-pay | Admitting: Internal Medicine

## 2023-11-25 ENCOUNTER — Ambulatory Visit: Payer: Medicare Other | Admitting: Internal Medicine

## 2023-11-25 VITALS — BP 109/66 | HR 103 | Ht 65.0 in | Wt 202.8 lb

## 2023-11-25 DIAGNOSIS — J449 Chronic obstructive pulmonary disease, unspecified: Secondary | ICD-10-CM | POA: Diagnosis not present

## 2023-11-25 DIAGNOSIS — F17210 Nicotine dependence, cigarettes, uncomplicated: Secondary | ICD-10-CM | POA: Diagnosis not present

## 2023-11-25 DIAGNOSIS — J9611 Chronic respiratory failure with hypoxia: Secondary | ICD-10-CM | POA: Diagnosis not present

## 2023-11-25 NOTE — Assessment & Plan Note (Signed)
 Active smoker - pfts at Dr Patric requested 11/25/2023    Group D (now reclassified as E) in terms of symptom/risk and laba/lama/ICS  therefore appropriate rx at this point >>>  symbiocrt / spiriva approp but she's not using the latter and doing fine for now.  - The proper method of use, as well as anticipated side effects, of a metered-dose inhaler were discussed and demonstrated to the patient using teach back method.   Re SABA :  I spent extra time with pt today reviewing appropriate use of albuterol  for prn use on exertion with the following points: 1) saba is for relief of sob that does not improve by walking a slower pace or resting but rather if the pt does not improve after trying this first. 2) If the pt is convinced, as many are, that saba helps recover from activity faster then it's easy to tell if this is the case by re-challenging : ie stop, take the inhaler, then p 5 minutes try the exact same activity (intensity of workload) that just caused the symptoms and see if they are substantially diminished or not after saba 3) if there is an activity that reproducibly causes the symptoms, try the saba 15 min before the activity on alternate days   If in fact the saba really does help, then fine to continue to use it prn but advised may need to look closer at the maintenance regimen being used to achieve better control of airways disease with exertion.

## 2023-11-25 NOTE — Assessment & Plan Note (Signed)
On 02 since 2015   She is not hypercarbic by last abg so rec titrate sats with walking > 90%

## 2023-11-25 NOTE — Telephone Encounter (Signed)
See if we can get her last PFTs from [facility] office in [place]

## 2023-11-25 NOTE — Assessment & Plan Note (Signed)
Counseled re importance of smoking cessation but did not meet time criteria for separate billing    Low-dose CT lung cancer screening is recommended for patients who are 29-72 years of age with a 20+ pack-year history of smoking and who are currently smoking or quit <=15 years ago. No coughing up blood  No unintentional weight loss of > 15 pounds in the last 6 months - pt is eligible for scanning yearly until age 89 but defer to PCP for now as part of a yearly eval.   F/u   6 weeks with all meds in hand using a trust but verify approach to confirm accurate Medication  Reconciliation The principal here is that until we are certain that the  patients are doing what we've asked, it makes no sense to ask them to do more.     Each maintenance medication was reviewed in detail including emphasizing most importantly the difference between maintenance and prns and under what circumstances the prns are to be triggered using an action plan format where appropriate.  Total time for H and P, chart review, counseling, reviewing hfa/02/pulse ox  device(s) and generating customized AVS unique to this office visit / same day charting = 41 min new pt eval

## 2023-11-25 NOTE — Patient Instructions (Signed)
 We will try to get your lung functions from Dr O'Neal's office but it may dificult as they are closed.  No change in medications   Make sure you check your oxygen saturation  AT  your highest level of activity (not after you stop)   to be sure it stays over 90% and adjust  02 flow upward to maintain this level if needed but remember to turn it back to previous settings when you stop (to conserve your supply).   The key is to stop smoking completely before smoking completely stops you!     Please schedule a follow up office visit in 6 weeks, call sooner if needed - bring inhalers

## 2023-11-26 NOTE — Addendum Note (Signed)
 Addended by: Salih Williamson N on: 11/26/2023 08:16 AM   Modules accepted: Orders

## 2023-11-28 NOTE — Telephone Encounter (Signed)
 Called and lvm for patient call us  back regarding getting studies from [facility]

## 2023-12-09 NOTE — Telephone Encounter (Signed)
 Forwarding no longer helping this office

## 2023-12-09 NOTE — Telephone Encounter (Signed)
 Per [facility] they have not seen this patient since >2019. They have no current records.

## 2023-12-18 ENCOUNTER — Telehealth: Payer: Self-pay | Admitting: Internal Medicine

## 2023-12-18 NOTE — Telephone Encounter (Signed)
 Spoke with patient regarding the Tuesday 01/27/24 3:00 pm PFT appointment at [facility] time is 2:45 pm 1st floor registration desk for check in---willmail information / instructions to patient and she voiced her understanding

## 2024-01-06 ENCOUNTER — Other Ambulatory Visit: Payer: Self-pay | Admitting: Physician Assistant

## 2024-01-06 ENCOUNTER — Inpatient Hospital Stay: Payer: Medicare Other | Attending: Hematology | Admitting: Oncology

## 2024-01-06 ENCOUNTER — Encounter (HOSPITAL_COMMUNITY): Payer: Self-pay | Admitting: Radiology

## 2024-01-06 ENCOUNTER — Telehealth: Payer: Self-pay

## 2024-01-06 ENCOUNTER — Ambulatory Visit (HOSPITAL_COMMUNITY)
Admission: RE | Admit: 2024-01-06 | Discharge: 2024-01-06 | Disposition: A | Discharge status: Home / Self Care | Payer: Medicare Other | Source: Ambulatory Visit | Attending: Hematology | Admitting: Hematology

## 2024-01-06 DIAGNOSIS — C3490 Malignant neoplasm of unspecified part of unspecified bronchus or lung: Secondary | ICD-10-CM | POA: Insufficient documentation

## 2024-01-06 DIAGNOSIS — D508 Other iron deficiency anemias: Secondary | ICD-10-CM

## 2024-01-06 DIAGNOSIS — D649 Anemia, unspecified: Secondary | ICD-10-CM

## 2024-01-06 DIAGNOSIS — D509 Iron deficiency anemia, unspecified: Secondary | ICD-10-CM | POA: Diagnosis present

## 2024-01-06 LAB — CBC WITH DIFFERENTIAL/PLATELET
Abs Immature Granulocytes: 0.03 10*3/uL (ref 0.00–0.07)
Basophils Absolute: 0.1 10*3/uL (ref 0.0–0.1)
Basophils Relative: 1 %
Eosinophils Absolute: 0 10*3/uL (ref 0.0–0.5)
Eosinophils Relative: 0 %
HCT: 22.4 % — ABNORMAL LOW (ref 36.0–46.0)
Hemoglobin: 6.4 g/dL — CL (ref 12.0–15.0)
Immature Granulocytes: 0 %
Lymphocytes Relative: 18 %
Lymphs Abs: 1.6 10*3/uL (ref 0.7–4.0)
MCH: 24.1 pg — ABNORMAL LOW (ref 26.0–34.0)
MCHC: 28.6 g/dL — ABNORMAL LOW (ref 30.0–36.0)
MCV: 84.2 fL (ref 80.0–100.0)
Monocytes Absolute: 0.9 10*3/uL (ref 0.1–1.0)
Monocytes Relative: 10 %
Neutro Abs: 6.4 10*3/uL (ref 1.7–7.7)
Neutrophils Relative %: 71 %
Platelets: 279 10*3/uL (ref 150–400)
RBC: 2.66 MIL/uL — ABNORMAL LOW (ref 3.87–5.11)
RDW: 20.2 % — ABNORMAL HIGH (ref 11.5–15.5)
WBC: 9 10*3/uL (ref 4.0–10.5)
nRBC: 0 % (ref 0.0–0.2)

## 2024-01-06 LAB — FERRITIN: Ferritin: 5 ng/mL — ABNORMAL LOW (ref 11–307)

## 2024-01-06 LAB — COMPREHENSIVE METABOLIC PANEL
ALT: 11 U/L (ref 0–44)
AST: 16 U/L (ref 15–41)
Albumin: 3.4 g/dL — ABNORMAL LOW (ref 3.5–5.0)
Alkaline Phosphatase: 54 U/L (ref 38–126)
Anion gap: 9 (ref 5–15)
BUN: 21 mg/dL (ref 8–23)
CO2: 26 mmol/L (ref 22–32)
Calcium: 9.4 mg/dL (ref 8.9–10.3)
Chloride: 101 mmol/L (ref 98–111)
Creatinine, Ser: 0.88 mg/dL (ref 0.44–1.00)
GFR, Estimated: >60 mL/min (ref >60)
Glucose, Bld: 109 mg/dL — ABNORMAL HIGH (ref 70–99)
Potassium: 4.1 mmol/L (ref 3.5–5.1)
Sodium: 136 mmol/L (ref 135–145)
Total Bilirubin: 0.2 mg/dL (ref 0.0–1.2)
Total Protein: 6.7 g/dL (ref 6.5–8.1)

## 2024-01-06 LAB — IRON AND TIBC
Iron: 11 ug/dL — ABNORMAL LOW (ref 28–170)
Saturation Ratios: 3 % — ABNORMAL LOW (ref 10.4–31.8)
TIBC: 422 ug/dL (ref 250–450)
UIBC: 411 ug/dL

## 2024-01-06 MED ORDER — IOHEXOL 300 MG/ML  SOLN
75.0000 mL | Freq: Once | INTRAMUSCULAR | Status: AC | PRN
  Administered 2024-01-06: 75 mL via INTRAVENOUS

## 2024-01-06 NOTE — Progress Notes (Signed)
 CRITICAL VALUE ALERT Critical value received:  hemoglobin 6.4 Date of notification:  01-06-24 Time of notification: 1416 Critical value read back:  Yes.   Nurse who received alert:  C. March Joos RN MD notified time and response:  1417 , Rojelio Brenner  PA-C. Will get 2 units of blood per MD orders.

## 2024-01-06 NOTE — Progress Notes (Signed)
 Notified by RN of Hgb 6.9.  Instructions given to nursing staff as follows: - Transfuse 2 units PRBC as soon as possible - If any hematochezia or melena, should proceed to ED - If any altered mental status, chest pain, syncope, or difficulty breathing should proceed to ED - Please add same-day CBC/BB sample when she sees me next week 01/13/2024   Carnella Guadalajara, PA-C 01/06/24 2:22 PM

## 2024-01-06 NOTE — Telephone Encounter (Signed)
 1519-spoke with daughter Misty Stanley. Called patient but voicemail was full. She will make sure her mother know about her hemoglobin and that she needs blood and labs tomorrow.

## 2024-01-07 ENCOUNTER — Inpatient Hospital Stay

## 2024-01-07 ENCOUNTER — Inpatient Hospital Stay: Admitting: Oncology

## 2024-01-07 DIAGNOSIS — D508 Other iron deficiency anemias: Secondary | ICD-10-CM

## 2024-01-07 DIAGNOSIS — D649 Anemia, unspecified: Secondary | ICD-10-CM

## 2024-01-07 DIAGNOSIS — D509 Iron deficiency anemia, unspecified: Secondary | ICD-10-CM | POA: Diagnosis not present

## 2024-01-07 LAB — PREPARE RBC (CROSSMATCH)

## 2024-01-07 LAB — ABO/RH: ABO/RH(D): O POS

## 2024-01-07 MED ORDER — DIPHENHYDRAMINE HCL 25 MG PO CAPS
25.0000 mg | ORAL_CAPSULE | Freq: Once | ORAL | Status: AC
  Administered 2024-01-07: 25 mg via ORAL
  Filled 2024-01-07: qty 1

## 2024-01-07 MED ORDER — ACETAMINOPHEN 325 MG PO TABS
650.0000 mg | ORAL_TABLET | Freq: Once | ORAL | Status: AC
  Administered 2024-01-07: 650 mg via ORAL
  Filled 2024-01-07: qty 2

## 2024-01-07 MED ORDER — SODIUM CHLORIDE 0.9% IV SOLUTION
250.0000 mL | INTRAVENOUS | Status: DC
  Administered 2024-01-07: 250 mL via INTRAVENOUS

## 2024-01-07 NOTE — Progress Notes (Signed)
 Patient presents today for 2 units of PRBCs. Patient tolerated blood transfusion with no complaints voiced. Peripheral IV site clean and dry with good blood return noted before and after infusion. Band aid applied. Patient instructed to stop by check out to get a new schedule. VSS with discharge and left in satisfactory condition with no s/s of distress noted.

## 2024-01-07 NOTE — Patient Instructions (Signed)
 [facility] - A DEPT OF [facility]  Discharge Instructions: Thank you for choosing [facility] to provide your oncology and hematology care.  If you have a lab appointment with the Cancer Center - please note that after April 8th, 2024, all labs will be drawn in the cancer center.  You do not have to check in or register with the main entrance as you have in the past but will complete your check-in in the cancer center.  Wear comfortable clothing and clothing appropriate for easy access to any Portacath or PICC line.   We strive to give you quality time with your provider. You may need to reschedule your appointment if you arrive late (15 or more minutes).  Arriving late affects you and other patients whose appointments are after yours.  Also, if you miss three or more appointments without notifying the office, you may be dismissed from the clinic at the provider's discretion.      For prescription refill requests, have your pharmacy contact our office and allow 72 hours for refills to be completed.    Today you received the following 2 Units of PRBCs, return as scheduled.   To help prevent nausea and vomiting after your treatment, we encourage you to take your nausea medication as directed.  BELOW ARE SYMPTOMS THAT SHOULD BE REPORTED IMMEDIATELY: *FEVER GREATER THAN 100.4 F (38 C) OR HIGHER *CHILLS OR SWEATING *NAUSEA AND VOMITING THAT IS NOT CONTROLLED WITH YOUR NAUSEA MEDICATION *UNUSUAL SHORTNESS OF BREATH *UNUSUAL BRUISING OR BLEEDING *URINARY PROBLEMS (pain or burning when urinating, or frequent urination) *BOWEL PROBLEMS (unusual diarrhea, constipation, pain near the anus) TENDERNESS IN MOUTH AND THROAT WITH OR WITHOUT PRESENCE OF ULCERS (sore throat, sores in mouth, or a toothache) UNUSUAL RASH, SWELLING OR PAIN  UNUSUAL VAGINAL DISCHARGE OR ITCHING   Items with * indicate a potential emergency and should be followed up as soon as  possible or go to the Emergency Department if any problems should occur.  Please show the CHEMOTHERAPY ALERT CARD or IMMUNOTHERAPY ALERT CARD at check-in to the Emergency Department and triage nurse.  Should you have questions after your visit or need to cancel or reschedule your appointment, please contact [facility]  - A DEPT OF [facility] [phone]  and follow the prompts.  Office hours are 8:00 a.m. to 4:30 p.m. Monday - Friday. Please note that voicemails left after 4:00 p.m. may not be returned until the following business day.  We are closed weekends and major holidays. You have access to a nurse at all times for urgent questions. Please call the main number to the clinic [phone] and follow the prompts.  For any non-urgent questions, you may also contact your provider using MyChart. We now offer e-Visits for anyone 9 and older to request care online for non-urgent symptoms. For details visit [email or web address].   Also download the MyChart app! Go to the app store, search "MyChart", open the app, select [facility], and log in with your MyChart username and password.

## 2024-01-08 LAB — TYPE AND SCREEN
ABO/RH(D): O POS
Antibody Screen: NEGATIVE
Unit division: 0
Unit division: 0

## 2024-01-08 LAB — BPAM RBC
Blood Product Expiration Date: 202504062359
Blood Product Expiration Date: 202504062359
ISSUE DATE / TIME: 202503191000
ISSUE DATE / TIME: 202503191144
Unit Type and Rh: 5100
Unit Type and Rh: 5100

## 2024-01-10 NOTE — Progress Notes (Unsigned)
 Carmen Hansen, female    DOB: September 07, 1951    MRN: [id number]   Brief patient profile:  22  yowf  active smoker  referred to pulmonary clinic in [place]  11/25/2023 by EDP- [facility]  for copd pred dep since 1999 / 02 dep x 2015    Discharge date: November 08, 2023 Length of stay: LOS: 5 days   Admission HPI   Patient admitted on: 11/02/2023 10:50 PM   CHIEF COMPLAINT: Fever/chills shortness of breath  Day of admission HPI:   This is a 73 y.o. female with a known history of RA, asthma/COPD on 2L home O2, HTN, HLD, lung cancer in remission, depression, anxiety, GERD, insomnia, prediabetes presents to the emergency department for evaluation of two week history of worsening SOB associated with cough and fever to 103 at home. In ER she was placed on BiPAP, was found to meet sepsis criteria and have a small PE without right heart strain. She is feeling better with BiPAP  In the ED the patient received cefepime,vancomycin, heparin, duoneb, methylpred. Medical admission was requested for further workup and management of acute on chronic hypoxic respiratory failure, PE, sepsis. She was admitted as an inpatient to the hospital service and initiated on IV heparin for her PE. Patient was also seen by general surgery for her perirectal abscess, status post I&D patient was admitted for sepsis right labial cellulitis.   She was initiated on broad-spectrum antibiotics this was de-escalated to Augmentin. Her respiratory status was closely monitored in the ICU. Patient was transition from IV heparin to Eliquis on the day of discharge she was hemodynamically stable and seen by general surgery patient is to follow-up with primary care physician and general surgery as scheduled. She is currently on Eliquis for her PE and p.o. Augmentin All acute and chronic comorbid's were addressed risk of decompensation mortality morbidity and death is moderate to high if the patient does not follow recommendations medications  follow-up appointments and compliance  Patient admitted on Home O2? -Yes Patient on home anticoagulant? -No Patient admitted with Chronic home foley catheter? - no Foley catheter placed or replaced by another service prior to admission? - no       ASSESSMENT & PLAN (In order of descending acuity) Right labial cellulitis/right perirectal abscess PE  Chronic medical problems COPD Hypertension History of lung cancer in remission Diabetes type 2      History of Present Illness  11/25/2023  Pulmonary/ 1st office eval/ Tamas Suen / [place] [facility]  pred 2.5 mg /day x 1999/ now on Eliquis for R DVT / small PE Chief Complaint  Patient presents with   Consult    Copd   Dyspnea:  doesn't need 02 at rest, drops with walking / housebound x nov 22 25  Cough: x years sporadic  > clear  Sleep: flat bed / 2 pillows  SABA use: none  02: 2lpm hs/ up to 3lpm  Rec We will try to get your lung functions from Dr Marylene Buerger office but it may dificult as they are closed. No change in medications  Make sure you check your oxygen saturation  AT  your highest level of activity (not after you stop)   to be sure it stays over 90%  The key is to stop smoking completely before smoking completely stops you!  Please schedule a follow up office visit in 6 weeks, call sooner if needed - bring inhalers     01/13/2024  f/u ov/[place] [facility]/[place] [facility] re: presumed copd maint on  Breztri  Chief Complaint  Patient presents with   Follow-up    Follow up for copd, when to the hospital  for a blood transfusion , she was taking eliquis 5mg  but she has taking in 1 week because she is out of refills.  Dyspnea: walking  room to room at home better  Cough: moderately congested > slt yellow  Sleeping: flat bed bed 2 pillows s    resp cc  SABA use: doesn't have one  02: 2lpm hs and prn daytime     No obvious day to day or daytime variability or assoc excess/ purulent sputum or mucus plugs or hemoptysis or cp or chest  tightness, subjective wheeze or overt sinus or hb symptoms.    Also denies any obvious fluctuation of symptoms with weather or environmental changes or other aggravating or alleviating factors except as outlined above   No unusual exposure hx or h/o childhood pna/ asthma or knowledge of premature birth.  Current Allergies, Complete Past Medical History, Past Surgical History, Family History, and Social History were reviewed in [employer] record.  ROS  The following are not active complaints unless bolded Hoarseness, sore throat, dysphagia, dental problems, itching, sneezing,  nasal congestion or discharge of excess mucus or purulent secretions, ear ache,   fever, chills, sweats, unintended wt loss or wt gain, classically pleuritic or exertional cp,  orthopnea pnd or arm/hand swelling  or leg swelling, presyncope, palpitations, abdominal pain, anorexia, nausea, vomiting, diarrhea  or change in bowel habits or change in bladder habits, change in stools or change in urine, dysuria, hematuria,  rash, arthralgias, visual complaints, headache, numbness, weakness or ataxia or problems with walking or coordination,  change in mood or  memory.        Current Meds  Medication Sig   albuterol (PROAIR HFA) 108 (90 Base) MCG/ACT inhaler Inhale 1-2 puffs into the lungs every 6 (six) hours as needed for wheezing or shortness of breath.    benzonatate (TESSALON) 100 MG capsule Take 1 capsule (100 mg total) by mouth every 8 (eight) hours.   BIOTIN PO Take 1 tablet by mouth daily.   budesonide-formoterol (SYMBICORT) 80-4.5 MCG/ACT inhaler Inhale 2 puffs into the lungs 2 (two) times daily.   cetirizine (ZYRTEC) 10 MG tablet Take 10 mg by mouth daily.   Cholecalciferol (VITAMIN D3) 5000 units CAPS Take 1 capsule by mouth daily.   cyclobenzaprine (FLEXERIL) 5 MG tablet Take 5 mg by mouth at bedtime.   dexlansoprazole (DEXILANT) 60 MG capsule Take 60 mg by mouth daily.   docusate sodium  (COLACE) 100 MG capsule Take 200 mg by mouth daily.   DULoxetine (CYMBALTA) 60 MG capsule Take 60 mg by mouth daily.   ferrous sulfate 325 (65 FE) MG tablet Take 1 tablet (325 mg total) by mouth daily.   folic acid (FOLVITE) 1 MG tablet Take 1 mg by mouth daily.   furosemide (LASIX) 20 MG tablet Take 1 tablet (20 mg total) by mouth daily.   gabapentin (NEURONTIN) 800 MG tablet Take 800 mg by mouth 4 (four) times daily.   hydroxychloroquine (PLAQUENIL) 200 MG tablet Take 200 mg by mouth 2 (two) times daily.   ipratropium-albuterol (DUONEB) 0.5-2.5 (3) MG/3ML SOLN Take 3 mLs by nebulization 4 (four) times daily.   montelukast (SINGULAIR) 10 MG tablet Take 10 mg by mouth at bedtime.   oxyCODONE-acetaminophen (PERCOCET/ROXICET) 5-325 MG tablet Take 1 tablet by mouth every 6 (six) hours as needed for severe pain (pain  score 7-10).   predniSONE (DELTASONE) 5 MG tablet Take 5 mg by mouth daily with breakfast.   rizatriptan (MAXALT) 10 MG tablet Take 10 mg by mouth as needed for migraine.    SPIRIVA RESPIMAT 2.5 MCG/ACT AERS Inhale 1 spray into the lungs daily.   topiramate (TOPAMAX) 50 MG tablet Take 50 mg by mouth 2 (two) times daily.   traMADol (ULTRAM) 50 MG tablet Take 50 mg by mouth every 6 (six) hours as needed for moderate pain.   zolpidem (AMBIEN) 10 MG tablet Take 10 mg by mouth at bedtime.            Past Medical History:  Diagnosis Date   Arthritis    Cancer (HCC)    Lupus    Migraines       Objective:    Wts   01/13/2024          11/25/23 202 lb 12.8 oz (92 kg)  10/08/23 215 lb 12.8 oz (97.9 kg)  09/28/23 221 lb (100.2 kg)      Vital signs reviewed  01/13/2024  - Note at rest 02 sats 95% on RA   General appearance:    elderly mod obese (by BMI)  amb  wf nad    HEENT : Oropharynx  clear     NECK :  without  apparent JVD/ palpable Nodes/TM    LUNGS: no acc muscle use,  Min barrel  contour chest wall with bilateral  slightly decreased bs s audible wheeze and   without cough on insp or exp maneuvers and min  Hyperresonant  to  percussion bilaterally    CV:  RRR  no s3 or murmur or increase in P2, and no edema   ABD:  soft and nontender with pos end  insp Hoover's  in the supine position.  No bruits or organomegaly appreciated   MS:  Nl gait/ ext warm without deformities Or obvious joint restrictions  calf tenderness, cyanosis or clubbing     SKIN: warm and dry without lesions    NEURO:  alert, approp, nl sensorium with  no motor or cerebellar deficits apparent.             I personally reviewed images and agree with radiology impression as follows:   Chest CT 3//18/25   w contrast    1. No evidence of recurrent lung cancer or metastatic disease. Recommend correlation with patient's clinical history. 2. No acute findings. No evidence of recurrent acute pulmonary embolism on this non dedicated study. 3. Aortic Atherosclerosis (ICD10-I70.0) and Emphysema - mild to moderate    Assessment

## 2024-01-12 ENCOUNTER — Other Ambulatory Visit: Payer: Self-pay

## 2024-01-12 DIAGNOSIS — D508 Other iron deficiency anemias: Secondary | ICD-10-CM

## 2024-01-12 DIAGNOSIS — D649 Anemia, unspecified: Secondary | ICD-10-CM

## 2024-01-12 NOTE — Progress Notes (Deleted)
 [facility] [address] [place] [address]   CLINIC:  Medical Oncology/Hematology  PCP:  [name], MD [address] [place] [address] [phone]   REASON FOR VISIT:  Follow-up for iron deficiency anemia and right lung mass  CURRENT THERAPY: IV iron infusions + PRBC transfusions as needed  INTERVAL HISTORY:   [name] 73 y.o. female returns for routine follow-up of iron deficiency anemia and right lung mass.  She was last seen by Dr. Ellin Saba on 10/08/2023.  She received IV Monoferric on 10/13/2023.  Since last visit, she was hospitalized at [facility] from 11/02/2023 through 11/08/2023 for sepsis related to labial/perirectal abscess, was also found to have small right-sided PE without right heart strain (CTA at [facility] on 11/03/2023), venous US negative for DVT in either lower extremity.  She was treated with heparin while inpatient, and was started on Eliquis at discharge.  Hgb trend during hospitalization shows Hgb 12.7 on 11/03/2023, dropped to Hgb 9.8 on 11/08/2023.    Follow-up hematology labs (01/06/2024) showed severe anemia with Hgb 6.4/MCV 84.2.  She was transfused 2 units PRBC on 01/07/2024.  At today's visit, she reports feeling ***.  IRON DEFICIENCY ANEMIA: *** *** Bleeding?? *** Bleeding *** Fatigue, pica *** Headaches, lightheadedness, syncope *** Chest pain, dyspnea on exertion *** EGD/colonoscopy??  [place], [place] [address]) ***  PULMONARY EMBOLISM: *** *** She is compliant with Eliquis 5 mg twice daily since hospital discharge (11/08/2023). *** Peripheral edema *** Respiratory symptoms  PROVOKING: *** Immobility or travel *** Hospitalization or surgery *** Injury *** History of malignancy and age appropriate screenings? *** Smoking *** Obesity, diabetes, HTN, CKD *** Pregnancy/oral contraceptives *** Heart failure, inflammatory bowel disease *** Personal history of DVT or PE *** Family history of DVT, PE, miscarriages  She has  ***% energy and ***% appetite. She endorses that she is maintaining a stable weight.    *** RESUME PREP BELOW ***    ASSESSMENT & PLAN:  1.  Severe iron deficiency anemia: - Referral from ER for abnormal labs on 09/28/2023, hemoglobin 8.1, ferritin 6 - Denies BRBPR/melena.  No ice pica.  No prior transfusions. - EGD/colonoscopy in [place] 2024*** - Cannot take iron pills due to constipation. - Received iron infusion last year and this year in [place]. - *** - We reviewed labs from recent ER visit. - We discussed parenteral iron therapy with Monoferric 1 g IV x 1.  We discussed rare chance of anaphylactic reaction.  Due to her multiple allergies, we will premedicate her prior to Monoferric. - Will schedule her for follow-up visit in 3 months with repeat CBC, ferritin and iron panel. - PLAN: ***  2.  Right-sided PE (January 2025) - *** - PLAN: ***   3.  Right lung mass: - She reports that she had right apical lung mass in 2018.  PET scan at that time showed hypermetabolic lung mass with mediastinal, right hilar and right supraclavicular nodal disease.  However repeat CT scan few months later reportedly showed resolution of the right apical mass but she is being followed for lung nodules. - We will schedule her for CT scan of the chest prior to next visit in March. - *** - PLAN: ***  4.  Social/family history: - Lives at home with her sister Carmen Hansen.  She is on 2 L/min oxygen by nasal cannula 24/7. - Current active smoker, 1 pack/day started at age 40. - Mother died of lung cancer.  Maternal aunt had lung  cancer.  Maternal grandmother had liver cancer. - *** - PLAN: ***    PLAN SUMMARY: >> *** >> *** >> ***   [facility] **VISIT SUMMARY & IMPORTANT INSTRUCTIONS **   You were seen today by Rojelio Brenner PA-C for your ***.    *** ***  *** ***  LABS: Return in ***   OTHER TESTS: ***  MEDICATIONS: ***  FOLLOW-UP  APPOINTMENT: ***     REVIEW OF SYSTEMS: ***  Review of Systems - Oncology   PHYSICAL EXAM:  ECOG PERFORMANCE STATUS: {CHL ONC ECOG ZO:[id number]} *** There were no vitals filed for this visit. There were no vitals filed for this visit. Physical Exam  PAST MEDICAL/SURGICAL HISTORY:  Past Medical History:  Diagnosis Date   Arthritis    Cancer (HCC)    Lupus    Migraines    Past Surgical History:  Procedure Laterality Date   ABDOMINAL HYSTERECTOMY     CHOLECYSTECTOMY      SOCIAL HISTORY:  Social History   Socioeconomic History   Marital status: Widowed    Spouse name: Not on file   Number of children: Not on file   Years of education: Not on file   Highest education level: Not on file  Occupational History   Not on file  Tobacco Use   Smoking status: Every Day    Current packs/day: 1.00    Average packs/day: 1 pack/day for 54.2 years (54.2 ttl pk-yrs)    Types: Cigarettes    Start date: 77   Smokeless tobacco: Never  Substance and Sexual Activity   Alcohol use: No   Drug use: No   Sexual activity: Not on file  Other Topics Concern   Not on file  Social History Narrative   Not on file   Social Drivers of Health   Financial Resource Strain: Medium Risk (06/05/2023)   Received from [facility]   Overall Financial Resource Strain (CARDIA)    Difficulty of Paying Living Expenses: Somewhat hard  Food Insecurity: No Food Insecurity (11/07/2023)   Received from [facility]   Hunger Vital Sign    Worried About Running Out of Food in the Last Year: Never true    Ran Out of Food in the Last Year: Never true  Transportation Needs: No Transportation Needs (11/03/2023)   Received from [facility]   PRAPARE - Transportation    Lack of Transportation (Medical): No    Lack of Transportation (Non-Medical): No  Physical Activity: Inactive (11/07/2023)   Received from [facility]   Exercise Vital Sign    Days of Exercise per Week: 0  days    Minutes of Exercise per Session: 0 min  Stress: Stress Concern Present (06/05/2023)   Received from [facility]   [employer] of Occupational Health - Occupational Stress Questionnaire    Feeling of Stress : To some extent  Social Connections: Moderately Integrated (11/07/2023)   Received from [facility]   Social Connection and Isolation Panel [NHANES]    Frequency of Communication with Friends and Family: More than three times a week    Frequency of Social Gatherings with Friends and Family: Twice a week    Attends Religious Services: More than 4 times per year    Active Member of [employer] or Organizations: Yes    Attends Banker Meetings: 1 to 4 times per year    Marital Status:  Widowed  Intimate Partner Violence: Not At Risk (06/05/2023)   Received from [facility]   Humiliation, Afraid, Rape, and Kick questionnaire    Fear of Current or Ex-Partner: No    Emotionally Abused: No    Physically Abused: No    Sexually Abused: No    FAMILY HISTORY:  No family history on file.  CURRENT MEDICATIONS:  Outpatient Encounter Medications as of 01/13/2024  Medication Sig   albuterol (PROAIR HFA) 108 (90 Base) MCG/ACT inhaler Inhale 1-2 puffs into the lungs every 6 (six) hours as needed for wheezing or shortness of breath.    benzonatate (TESSALON) 100 MG capsule Take 1 capsule (100 mg total) by mouth every 8 (eight) hours.   BIOTIN PO Take 1 tablet by mouth daily.   budesonide-formoterol (SYMBICORT) 80-4.5 MCG/ACT inhaler Inhale 2 puffs into the lungs 2 (two) times daily.   cetirizine (ZYRTEC) 10 MG tablet Take 10 mg by mouth daily.   Cholecalciferol (VITAMIN D3) 5000 units CAPS Take 1 capsule by mouth daily.   cyclobenzaprine (FLEXERIL) 5 MG tablet Take 5 mg by mouth at bedtime.   dexlansoprazole (DEXILANT) 60 MG capsule Take 60 mg by mouth daily.   docusate sodium (COLACE) 100 MG capsule Take 200 mg by mouth daily.    DULoxetine (CYMBALTA) 60 MG capsule Take 60 mg by mouth daily.   ferrous sulfate 325 (65 FE) MG tablet Take 1 tablet (325 mg total) by mouth daily.   folic acid (FOLVITE) 1 MG tablet Take 1 mg by mouth daily.   furosemide (LASIX) 20 MG tablet Take 1 tablet (20 mg total) by mouth daily.   gabapentin (NEURONTIN) 800 MG tablet Take 800 mg by mouth 4 (four) times daily.   hydroxychloroquine (PLAQUENIL) 200 MG tablet Take 200 mg by mouth 2 (two) times daily.   ipratropium-albuterol (DUONEB) 0.5-2.5 (3) MG/3ML SOLN Take 3 mLs by nebulization 4 (four) times daily.   montelukast (SINGULAIR) 10 MG tablet Take 10 mg by mouth at bedtime.   oxyCODONE-acetaminophen (PERCOCET/ROXICET) 5-325 MG tablet Take 1 tablet by mouth every 6 (six) hours as needed for severe pain (pain score 7-10).   predniSONE (DELTASONE) 5 MG tablet Take 5 mg by mouth daily with breakfast.   rizatriptan (MAXALT) 10 MG tablet Take 10 mg by mouth as needed for migraine.    SPIRIVA RESPIMAT 2.5 MCG/ACT AERS Inhale 1 spray into the lungs daily.   topiramate (TOPAMAX) 50 MG tablet Take 50 mg by mouth 2 (two) times daily.   traMADol (ULTRAM) 50 MG tablet Take 50 mg by mouth every 6 (six) hours as needed for moderate pain.   zolpidem (AMBIEN) 10 MG tablet Take 10 mg by mouth at bedtime.   No facility-administered encounter medications on file as of 01/13/2024.    ALLERGIES:  Allergies  Allergen Reactions   Celecoxib Hives   Hydrocodone-Acetaminophen Itching    Other Reaction(s): analgesics-opioid   Oxycodone-Acetaminophen     Other Reaction(s): analgesics-opioid  rash   Roflumilast Itching    Other Reaction(s): antiasthmatic and bronchodilator agents   Sulfamethoxazole     Other Reaction(s): Allergy to sulfonamides  itching,hives   Sulfamethoxazole-Trimethoprim     Other Reaction(s): anti-infective agents-misc   Tramadol     itching   Leflunomide Itching and Rash    Other Reaction(s): analgesics-anti  inflammatory  itching   Sulfa Antibiotics Rash   Sulfasalazine Rash    LABORATORY DATA:  I have reviewed the labs as listed.  CBC  Component Value Date/Time   WBC 9.0 01/06/2024 1259   RBC 2.66 (L) 01/06/2024 1259   HGB 6.4 (LL) 01/06/2024 1259   HCT 22.4 (L) 01/06/2024 1259   PLT 279 01/06/2024 1259   MCV 84.2 01/06/2024 1259   MCH 24.1 (L) 01/06/2024 1259   MCHC 28.6 (L) 01/06/2024 1259   RDW 20.2 (H) 01/06/2024 1259   LYMPHSABS 1.6 01/06/2024 1259   MONOABS 0.9 01/06/2024 1259   EOSABS 0.0 01/06/2024 1259   BASOSABS 0.1 01/06/2024 1259      Latest Ref Rng & Units 01/06/2024   12:59 PM 10/23/2023    1:12 PM 09/28/2023    2:35 PM  CMP  Glucose 70 - 99 mg/dL 161  096  98   BUN 8 - 23 mg/dL 21  18  12    Creatinine 0.44 - 1.00 mg/dL 0.45  4.09  8.11   Sodium 135 - 145 mmol/L 136  135  136   Potassium 3.5 - 5.1 mmol/L 4.1  4.0  3.8   Chloride 98 - 111 mmol/L 101  100  102   CO2 22 - 32 mmol/L 26  26  25    Calcium 8.9 - 10.3 mg/dL 9.4  8.4  8.9   Total Protein 6.5 - 8.1 g/dL 6.7   6.8   Total Bilirubin 0.0 - 1.2 mg/dL 0.2   0.5   Alkaline Phos 38 - 126 U/L 54   75   AST 15 - 41 U/L 16   19   ALT 0 - 44 U/L 11   16     DIAGNOSTIC IMAGING:  I have independently reviewed the relevant imaging and discussed with the patient.   WRAP UP:  All questions were answered. The patient knows to call the clinic with any problems, questions or concerns.  Medical decision making: ***  Time spent on visit: I spent *** minutes counseling the patient face to face. The total time spent in the appointment was *** minutes and more than 50% was on counseling.  Carnella Guadalajara, PA-C  ***

## 2024-01-13 ENCOUNTER — Inpatient Hospital Stay: Payer: Medicare Other | Admitting: Physician Assistant

## 2024-01-13 ENCOUNTER — Ambulatory Visit: Payer: Medicare Other | Admitting: Internal Medicine

## 2024-01-13 ENCOUNTER — Encounter: Payer: Self-pay | Admitting: Internal Medicine

## 2024-01-13 ENCOUNTER — Inpatient Hospital Stay

## 2024-01-13 VITALS — BP 128/77 | HR 95 | Ht 65.0 in | Wt 199.0 lb

## 2024-01-13 DIAGNOSIS — F17210 Nicotine dependence, cigarettes, uncomplicated: Secondary | ICD-10-CM | POA: Diagnosis not present

## 2024-01-13 DIAGNOSIS — J9611 Chronic respiratory failure with hypoxia: Secondary | ICD-10-CM

## 2024-01-13 DIAGNOSIS — J449 Chronic obstructive pulmonary disease, unspecified: Secondary | ICD-10-CM | POA: Diagnosis not present

## 2024-01-13 MED ORDER — AZITHROMYCIN 250 MG PO TABS: ORAL_TABLET | ORAL | 0 refills | Status: DC

## 2024-01-13 MED ORDER — ALBUTEROL SULFATE HFA 108 (90 BASE) MCG/ACT IN AERS
INHALATION_SPRAY | RESPIRATORY_TRACT | 11 refills | Status: DC
Start: 2024-01-13 — End: 2024-03-08

## 2024-01-13 NOTE — Patient Instructions (Addendum)
 Zpak for your congested cough    Proaire (called in = albuterol)  Only use your albuterol as a rescue medication to be used if you can't catch your breath by resting or doing a relaxed purse lip breathing pattern.  - The less you use it, the better it will work when you need it. - Ok to use up to 2 puffs  every 4 hours if you must but call for immediate appointment if use goes up over your usual need - Don't leave home without it !!  (think of it like the spare tire for your car)  Also  Ok to try albuterol 15 min before an activity (on alternating days)  that you know would usually make you short of breath and see if it makes any difference and if makes none then don't take albuterol after activity unless you can't catch your breath as this means it's the resting that helps, not the albuterol.     Work on inhaler technique:  relax and gently blow all the way out then take a nice smooth full deep breath back in, triggering the inhaler at same time you start breathing in.  Hold breath in for at least  5 seconds if you can. Blow out breztri  thru nose. Rinse and gargle with water when done.  If mouth or throat bother you at all,  try brushing teeth/gums/tongue with arm and hammer toothpaste/ make a slurry and gargle and spit out.   Make sure you check your oxygen saturation  AT  your highest level of activity (not after you stop)   to be sure it stays over 90% and adjust  02 flow upward to maintain this level if needed but remember to turn it back to previous settings when you stop (to conserve your supply).   I will call to get report on your ct chest today and let you know the findings/ recs   The key is to stop smoking completely before smoking completely stops you!  For smoking cessation classes call [phone]      Please schedule a follow up visit in 3 months but call sooner if needed with PFTs on return

## 2024-01-15 ENCOUNTER — Telehealth: Payer: Self-pay | Admitting: Internal Medicine

## 2024-01-15 NOTE — Assessment & Plan Note (Signed)
 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 2) If the pt is convinced, as many are, that saba helps recover from activity faster then it's easy to tell if this is the case by re-challenging : ie stop, take the inhaler, then p 5 minutes try the exact same activity (intensity of workload) that just caused the symptoms and see if they are substantially diminished or not after saba 3) if there is an activity that reproducibly causes the symptoms, try the saba 15 min before the activity on alternate days   If in fact the saba really does help, then fine to continue to use it prn but advised may need to look closer at the maintenance regimen being used to achieve better control of airways disease with exertion.   Rx zpak F/u with pfts

## 2024-01-15 NOTE — Assessment & Plan Note (Signed)
 Counseled re importance of smoking cessation but did not meet time criteria for separate billing           Each maintenance medication was reviewed in detail including emphasizing most importantly the difference between maintenance and prns and under what circumstances the prns are to be triggered using an action plan format where appropriate.  Total time for H and P, chart review, counseling, reviewing hfa/02/ pulse ox  device(s) and generating customized AVS unique to this office visit / same day charting = 33 min

## 2024-01-15 NOTE — Assessment & Plan Note (Signed)
 On 02 since 2015  2lpm hs and prn   Advised:  Make sure you check your oxygen saturation  AT  your highest level of activity (not after you stop)   to be sure it stays over 90% and adjust  02 flow upward to maintain this level if needed but remember to turn it back to previous settings when you stop (to conserve your supply).

## 2024-01-15 NOTE — Telephone Encounter (Signed)
 Let her know I reviewed ct and does show mild /mod emphysema so needs to quit smoking now if possible and return for pfts as planned  Be sure patient has/keeps f/u ov so we can go over all the details of this study and get a plan together moving forward - ok to move up f/u if not feeling better and wants to be seen sooner

## 2024-01-19 ENCOUNTER — Other Ambulatory Visit: Payer: Self-pay

## 2024-01-19 DIAGNOSIS — D508 Other iron deficiency anemias: Secondary | ICD-10-CM

## 2024-01-19 DIAGNOSIS — D649 Anemia, unspecified: Secondary | ICD-10-CM

## 2024-01-19 NOTE — Progress Notes (Deleted)
 [facility] [address] [place] [address]   CLINIC:  Medical Oncology/Hematology  PCP:  Carmen Benne, MD [address] [place] [address] [phone]   REASON FOR VISIT:  Follow-up for iron deficiency anemia and right lung mass  CURRENT THERAPY: IV iron infusions + PRBC transfusions as needed  INTERVAL HISTORY:   Carmen Hansen 73 y.o. female returns for routine follow-up of iron deficiency anemia and right lung mass.  She was last seen by Dr. Ellin Saba on 10/08/2023.  She received IV Monoferric on 10/13/2023.  Since last visit, she was hospitalized at [facility] from 11/02/2023 through 11/08/2023 for sepsis related to labial/perirectal abscess, was also found to have small right-sided PE without right heart strain (CTA at [facility] on 11/03/2023), venous US negative for DVT in either lower extremity.  She was treated with heparin while inpatient, and was started on Eliquis at discharge.  Hgb trend during hospitalization shows Hgb 12.7 on 11/03/2023, dropped to Hgb 9.8 on 11/08/2023.    Follow-up hematology labs (01/06/2024) showed severe anemia with Hgb 6.4/MCV 84.2.  She was transfused 2 units PRBC on 01/07/2024.  At today's visit, she reports feeling ***.  IRON DEFICIENCY ANEMIA: *** *** Bleeding?? *** Bleeding *** Fatigue, pica *** Headaches, lightheadedness, syncope *** Chest pain, dyspnea on exertion *** EGD/colonoscopy??  [place], [place] [address]) ***  PULMONARY EMBOLISM: *** *** She is compliant with Eliquis 5 mg twice daily since hospital discharge (11/08/2023). *** Peripheral edema *** Respiratory symptoms  PROVOKING: *** Immobility or travel *** Hospitalization or surgery *** Injury *** History of malignancy and age appropriate screenings? *** Smoking *** Obesity, diabetes, HTN, CKD *** Pregnancy/oral contraceptives *** Heart failure, inflammatory bowel disease *** Personal history of DVT or PE *** Family history of DVT, PE, miscarriages  She has  ***% energy and ***% appetite. She endorses that she is maintaining a stable weight.   ASSESSMENT & PLAN:  1.  Severe iron deficiency anemia: - Referral from ER for abnormal labs on 09/28/2023, hemoglobin 8.1, ferritin 6 - Reportedly had EGD/colonoscopy in [place], [place] in 2024, but results not viewable via Care Everywhere - Unable to tolerate iron pills due to constipation.  Previously received IV iron in [place]. - IV Monoferric on 10/13/2023 (with PREMEDICATION due to multiple medication allergies) - Received PRBC x 2 on 01/07/2024 due to Hgb 6.4.   - She has been on Eliquis since January 2025 for treatment of PE.  *** - Most recent labs (01/06/2024): Hgb 6.4/MCV 84.2.  Creatinine 0.88.  Ferritin 5, iron saturation 3%. - Denies rectal bleeding or melena.  *** - *** Symptoms *** - PLAN: *** IV Monoferric monthly x 2 *** - CBC/BB sample every 2 weeks *** - CBC and iron panel in 1 month (same day a second dose of IV Monoferric) *** - ***Check stool cards x 3.  *** - Obtain EGD/colonoscopy results from [place], [place] - RTC 2 months (1 month after second dose of Monoferric) with same-day labs to include CBC/BB sample, ferritin, iron/TIBC. - If she has persistent anemia despite adequate iron stores, would consider additional workup in the future.  ***  2.  Right-sided PE (January 2025) - Hospitalized at [facility] from 11/02/2023 through 11/08/2023 for sepsis related to labial/perirectal abscess, was also found to have small right-sided PE without right heart strain (CTA at [facility] on 11/03/2023) - Venous US negative for DVT in either lower extremity. - Treated with heparin while inpatient, and was started on Eliquis at discharge.  Hgb trend during hospitalization shows Hgb 12.7 on 11/03/2023, dropped to Hgb 9.8 on 11/08/2023. - *** Symptoms *** - *** Provoking factors *** - *** Family history *** - PLAN: *** Check coagulopathy labs *** - Continue Eliquis for 3 to 6 months for treatment of PE ***.   Would consider indefinite anticoagulation *** in the setting of ***???, but this would need to be carefully balanced with suspected ongoing GI blood loss.  ***  3.  History of right lung mass & lung nodules*** - She reports that she had right apical lung mass in 2018.  PET scan at that time showed hypermetabolic lung mass with mediastinal, right hilar and right supraclavicular nodal disease.  However repeat CT scan few months later reportedly showed resolution of the right apical mass but she is being followed for lung nodules. - Most recent CT chest (01/06/2024) showed no suspicious pulmonary nodularities or masses.   No evidence of recurrent lung cancer or metastatic disease. - PLAN: *** Due to current active tobacco use, high risk for lung cancer.  Continue with annual CT chest (LDCT chest/LCS), next due March 2026.  ***  4.  Social/family history: - Lives at home with her sister Carmen Hansen.  She is on 2 L/min oxygen by nasal cannula 24/7. - Current active smoker, 1 pack/day started at age 54. - Mother died of lung cancer.  Maternal aunt had lung cancer.  Maternal grandmother had liver cancer.  PLAN SUMMARY: >> *** >> *** >> ***   [facility] **VISIT SUMMARY & IMPORTANT INSTRUCTIONS **   You were seen today by Rojelio Brenner PA-C for your ***.    *** ***  *** ***  LABS: Return in ***   OTHER TESTS: ***  MEDICATIONS: ***  FOLLOW-UP APPOINTMENT: ***     REVIEW OF SYSTEMS: ***  Review of Systems - Oncology   PHYSICAL EXAM:  ECOG PERFORMANCE STATUS: {CHL ONC ECOG ZO:[id number]} *** There were no vitals filed for this visit. There were no vitals filed for this visit. Physical Exam  PAST MEDICAL/SURGICAL HISTORY:  Past Medical History:  Diagnosis Date   Arthritis    Cancer (HCC)    Lupus    Migraines    Past Surgical History:  Procedure Laterality Date   ABDOMINAL HYSTERECTOMY     CHOLECYSTECTOMY      SOCIAL HISTORY:   Social History   Socioeconomic History   Marital status: Widowed    Spouse name: Not on file   Number of children: Not on file   Years of education: Not on file   Highest education level: Not on file  Occupational History   Not on file  Tobacco Use   Smoking status: Every Day    Current packs/day: 1.00    Average packs/day: 1 pack/day for 54.2 years (54.2 ttl pk-yrs)    Types: Cigarettes    Start date: 66   Smokeless tobacco: Never  Substance and Sexual Activity   Alcohol use: No   Drug use: No   Sexual activity: Not on file  Other Topics Concern   Not on file  Social History Narrative   Not on file   Social Drivers of Health   Financial Resource Strain: Medium Risk (06/05/2023)   Received from [facility]   Overall Financial Resource Strain (CARDIA)    Difficulty of Paying Living Expenses: Somewhat hard  Food Insecurity: No Food Insecurity (11/07/2023)   Received from [facility]  Hunger Vital Sign    Worried About Running Out of Food in the Last Year: Never true    Ran Out of Food in the Last Year: Never true  Transportation Needs: No Transportation Needs (11/03/2023)   Received from [facility] - [facility]    Lack of Transportation (Medical): No    Lack of Transportation (Non-Medical): No  Physical Activity: Inactive (11/07/2023)   Received from [facility]   Exercise Vital Sign    Days of Exercise per Week: 0 days    Minutes of Exercise per Session: 0 min  Stress: Stress Concern Present (06/05/2023)   Received from [facility]   [employer] of Occupational Health - Occupational Stress Questionnaire    Feeling of Stress : To some extent  Social Connections: Moderately Integrated (11/07/2023)   Received from [facility]   Social Connection and Isolation Panel [NHANES]    Frequency of Communication with Friends and Family: More than three times a week    Frequency of Social  Gatherings with Friends and Family: Twice a week    Attends Religious Services: More than 4 times per year    Active Member of [employer] or Organizations: Yes    Attends Banker Meetings: 1 to 4 times per year    Marital Status: Widowed  Intimate Partner Violence: Not At Risk (06/05/2023)   Received from [facility]   Humiliation, Afraid, Rape, and Kick questionnaire    Fear of Current or Ex-Partner: No    Emotionally Abused: No    Physically Abused: No    Sexually Abused: No    FAMILY HISTORY:  No family history on file.  CURRENT MEDICATIONS:  Outpatient Encounter Medications as of 01/20/2024  Medication Sig   albuterol (PROAIR HFA) 108 (90 Base) MCG/ACT inhaler Inhale 1-2 puffs into the lungs every 6 (six) hours as needed for wheezing or shortness of breath.    albuterol (PROAIR HFA) 108 (90 Base) MCG/ACT inhaler 2 puffs every 4 hours as needed only  if your can't catch your breath   azithromycin (ZITHROMAX) 250 MG tablet Take 2 on day one then 1 daily x 4 days   benzonatate (TESSALON) 100 MG capsule Take 1 capsule (100 mg total) by mouth every 8 (eight) hours.   BIOTIN PO Take 1 tablet by mouth daily.   budesonide-formoterol (SYMBICORT) 80-4.5 MCG/ACT inhaler Inhale 2 puffs into the lungs 2 (two) times daily.   cetirizine (ZYRTEC) 10 MG tablet Take 10 mg by mouth daily.   Cholecalciferol (VITAMIN D3) 5000 units CAPS Take 1 capsule by mouth daily.   cyclobenzaprine (FLEXERIL) 5 MG tablet Take 5 mg by mouth at bedtime.   dexlansoprazole (DEXILANT) 60 MG capsule Take 60 mg by mouth daily.   docusate sodium (COLACE) 100 MG capsule Take 200 mg by mouth daily.   DULoxetine (CYMBALTA) 60 MG capsule Take 60 mg by mouth daily.   ELIQUIS 5 MG TABS tablet Take 5 mg by mouth 2 (two) times daily. (Patient not taking: Reported on 01/13/2024)   ferrous sulfate 325 (65 FE) MG tablet Take 1 tablet (325 mg total) by mouth daily.   folic acid (FOLVITE) 1 MG tablet Take 1  mg by mouth daily.   furosemide (LASIX) 20 MG tablet Take 1 tablet (20 mg total) by mouth daily.   gabapentin (NEURONTIN) 800 MG tablet Take 800 mg by mouth 4 (four) times daily.   hydroxychloroquine (PLAQUENIL) 200  MG tablet Take 200 mg by mouth 2 (two) times daily.   ipratropium-albuterol (DUONEB) 0.5-2.5 (3) MG/3ML SOLN Take 3 mLs by nebulization 4 (four) times daily.   montelukast (SINGULAIR) 10 MG tablet Take 10 mg by mouth at bedtime.   oxyCODONE-acetaminophen (PERCOCET/ROXICET) 5-325 MG tablet Take 1 tablet by mouth every 6 (six) hours as needed for severe pain (pain score 7-10).   predniSONE (DELTASONE) 5 MG tablet Take 5 mg by mouth daily with breakfast.   rizatriptan (MAXALT) 10 MG tablet Take 10 mg by mouth as needed for migraine.    SPIRIVA RESPIMAT 2.5 MCG/ACT AERS Inhale 1 spray into the lungs daily.   topiramate (TOPAMAX) 50 MG tablet Take 50 mg by mouth 2 (two) times daily.   traMADol (ULTRAM) 50 MG tablet Take 50 mg by mouth every 6 (six) hours as needed for moderate pain.   zolpidem (AMBIEN) 10 MG tablet Take 10 mg by mouth at bedtime.   No facility-administered encounter medications on file as of 01/20/2024.    ALLERGIES:  Allergies  Allergen Reactions   Celecoxib Hives   Hydrocodone-Acetaminophen Itching    Other Reaction(s): analgesics-opioid   Oxycodone-Acetaminophen     Other Reaction(s): analgesics-opioid  rash   Roflumilast Itching    Other Reaction(s): antiasthmatic and bronchodilator agents   Sulfamethoxazole     Other Reaction(s): Allergy to sulfonamides  itching,hives   Sulfamethoxazole-Trimethoprim     Other Reaction(s): anti-infective agents-misc   Tramadol     itching   Leflunomide Itching and Rash    Other Reaction(s): analgesics-anti inflammatory  itching   Sulfa Antibiotics Rash   Sulfasalazine Rash    LABORATORY DATA:  I have reviewed the labs as listed.  CBC    Component Value Date/Time   WBC 9.0 01/06/2024 1259   RBC 2.66 (L)  01/06/2024 1259   HGB 6.4 (LL) 01/06/2024 1259   HCT 22.4 (L) 01/06/2024 1259   PLT 279 01/06/2024 1259   MCV 84.2 01/06/2024 1259   MCH 24.1 (L) 01/06/2024 1259   MCHC 28.6 (L) 01/06/2024 1259   RDW 20.2 (H) 01/06/2024 1259   LYMPHSABS 1.6 01/06/2024 1259   MONOABS 0.9 01/06/2024 1259   EOSABS 0.0 01/06/2024 1259   BASOSABS 0.1 01/06/2024 1259      Latest Ref Rng & Units 01/06/2024   12:59 PM 10/23/2023    1:12 PM 09/28/2023    2:35 PM  CMP  Glucose 70 - 99 mg/dL 161  096  98   BUN 8 - 23 mg/dL 21  18  12    Creatinine 0.44 - 1.00 mg/dL 0.45  4.09  8.11   Sodium 135 - 145 mmol/L 136  135  136   Potassium 3.5 - 5.1 mmol/L 4.1  4.0  3.8   Chloride 98 - 111 mmol/L 101  100  102   CO2 22 - 32 mmol/L 26  26  25    Calcium 8.9 - 10.3 mg/dL 9.4  8.4  8.9   Total Protein 6.5 - 8.1 g/dL 6.7   6.8   Total Bilirubin 0.0 - 1.2 mg/dL 0.2   0.5   Alkaline Phos 38 - 126 U/L 54   75   AST 15 - 41 U/L 16   19   ALT 0 - 44 U/L 11   16     DIAGNOSTIC IMAGING:  I have independently reviewed the relevant imaging and discussed with the patient.   WRAP UP:  All questions were answered. The patient knows to call  the clinic with any problems, questions or concerns.  Medical decision making: ***  Time spent on visit: I spent *** minutes counseling the patient face to face. The total time spent in the appointment was *** minutes and more than 50% was on counseling.  Carnella Guadalajara, PA-C  ***

## 2024-01-20 ENCOUNTER — Encounter: Payer: Self-pay | Admitting: Physician Assistant

## 2024-01-20 ENCOUNTER — Inpatient Hospital Stay

## 2024-01-20 ENCOUNTER — Ambulatory Visit: Payer: Medicare Other | Admitting: Internal Medicine

## 2024-01-20 ENCOUNTER — Inpatient Hospital Stay: Admitting: Physician Assistant

## 2024-01-20 NOTE — Telephone Encounter (Signed)
 I left a message for the patient to return my call.

## 2024-01-20 NOTE — Progress Notes (Signed)
 Patient was scheduled for repeat labs and office visit today (01/20/2024), but called to cancel that visit.  She had also canceled her lab check and office visit last week.  Prior to that, her labs from 01/06/2024 showed severe anemia with Hgb 6.4/MCV 84.2.  She was transfused 2 units PRBC on 01/07/2024.  I am very concerned that her anemia has worsened in the past 2 weeks, and requested nurse call to check on patient.  Per patient's conversation with RN Cynda Acres, she "just felt too weak to come in" and "can barely get around."  Per RN, patient was having difficulty getting her words out.  RN asked if she could call a family member on behalf of the patient, but patient declined.  Patient was given instructions to call 911 and proceed to ED.  We will call back later to check.  At this point, I highly suspect that her anemia has worsened.  Depending on what labs from ED show, she may benefit from hospital admission and emergent GI workup for possible bleeding.  Of note, in the interim since her last hematology office visit, she was diagnosed with PE and started on Eliquis at outside hospital.    Further plan TBD, pending developing events and possible ED visit today.  When we reschedule her, we will plan on giving same-day IV Monoferric along with repeat CBC/BB sample on the same day as her office visit.  Rojelio Brenner PA-C 01/20/2024 10:56 AM

## 2024-01-21 ENCOUNTER — Telehealth: Payer: Self-pay | Admitting: *Deleted

## 2024-01-21 NOTE — Telephone Encounter (Signed)
 Patient had cancelled appointments yesterday. Stated that she was very sick with weakness and SOB.  She was encouraged to have someone take her to the hospital or call 911, as her hgb has been running low recently and has canceled her last 2 lab and provider visits.  I have attempted to reach out to her by telephone to check on her status and am unable to reach her.  She had requested that I not contact her daughter when I spoke to her yesterday, therefore I abided by her wishes.

## 2024-01-21 NOTE — Telephone Encounter (Signed)
 PT ret call. States she has a PFT next week. I made a 02/19/24 appt for her as a FU. If Dr. Sherene Sires wants to see her please call her to sched sooner appt. NFN

## 2024-01-22 ENCOUNTER — Telehealth: Payer: Self-pay | Admitting: *Deleted

## 2024-01-22 NOTE — Telephone Encounter (Signed)
 Had attempted to contact patient multiple times after advising her to go to the ER due to low hgb and 2 missed appointments for blood work and worsening symptoms of anemia.  Reached out to her today and she states she feels a little better and did not go to the ER.  Reminded her of appointments for Monday and stressed that she needs to make those appointments.  Also encouraged her to report to the ER if she becomes symptomatic over the weekend.  Verbalized understanding.

## 2024-01-23 NOTE — Progress Notes (Unsigned)
 [facility] [address], [place] [address]   CLINIC:  Medical Oncology/Hematology  PCP:  [name], MD [address] [place] [address] [phone]   REASON FOR VISIT:  Follow-up for iron deficiency anemia and right lung mass  CURRENT THERAPY: IV iron infusions + PRBC transfusions as needed  INTERVAL HISTORY:   [name] 73 y.o. female returns for routine follow-up of iron deficiency anemia and right lung mass.  She was last seen by Dr. Ellin Saba on 10/08/2023.  She received IV Monoferric on 10/13/2023.  At today's visit, she reports feeling poorly due to ongoing fatigue.  IRON DEFICIENCY ANEMIA: She notes onset of melanotic stool following her hospitalization at [facility] in January 2025, during which she was started on Eliquis due to PE.  (Of note, Hgb prior to starting Eliquis was 12.7 on 09/02/2024, dropped to Hgb 9.8 as of 11/08/2023.) patient reports that she stopped Eliquis toward the end of March 2025 (see below), and has not noticed any black bowel movements since stopping Eliquis.  She is symptomatic with significant fatigue and some dyspnea on exertion.  She has chronic headaches.  She denies any ice pica, lightheadedness, syncope, or chest pain.  She reports that she has had issues with iron deficiency anemia for "several years," but that EGD, capsule study, and colonoscopy performed in [place], [place] in 2024 were negative for any active bleeding.  Follow-up hematology labs (01/06/2024) showed severe anemia with Hgb 6.4/MCV 84.2.  She was transfused 2 units PRBC on 01/07/2024.  PULMONARY EMBOLISM: Hospitalized at [facility] from 11/02/2023 through 11/08/2023 for sepsis related to labial/perirectal abscess, was also found to have small right-sided PE without right heart strain (CTA at [facility] on 11/03/2023), venous US negative for DVT in either lower extremity.  She was treated with heparin while inpatient, and was started on Eliquis at discharge.  Patient was  compliant with Eliquis 5 mg twice daily, but reports that she stopped taking it towards the end of March when she ran out of her prescription and also "thought she could stop taking it since her CT scan from March did not show the blood clot."  She reports baseline dyspnea on exertion and denies any chest pain, hemoptysis, or palpitations.  She has chronic intermittent bilateral edema of lower extremities, but denies any unilateral leg swelling.  As noted above, she had melanotic stool while she was on Eliquis.  Pulmonary embolism thought to be weakly provoked in the setting of hospital admission for sepsis and cellulitis.  Prior to hospital admission, she had not had any preceding travel, injury, or recent surgeries.  She does note some decreased mobility at baseline, and spends the majority of her day in a chair with her legs propped up, but reports that she gets up throughout the day and does not spend more than 2 hours sitting at a time.  Persistent risk factors for VTE include smoking, obesity, and hypertension.  She denies any history of hormone supplements, heart failure, IBD, diabetes, or CKD.  She is up-to-date on her age-appropriate cancer screenings.  No personal or family history of DVT, PE, or miscarriages.  She has 30% energy and 50% appetite. She endorses that she is maintaining a stable weight.  ASSESSMENT & PLAN:  1.  Severe iron deficiency anemia: - Referral from ER for abnormal labs on 09/28/2023, hemoglobin 8.1, ferritin 6 - Per patient, has had unexplained iron deficiency anemia since around 2022, has received IV iron infusions in [place] prior  to moving to [facility]. - Records received via [facility] in [place], [place] Colonoscopy (07/19/2022): Significant fixed looping of colon with moderate to severe diverticulosis.  No bleeding noted. Upper GI endoscopy (07/19/2022): Chronic gastritis characterized by congestion and erythema in the entire examined stomach (pathology showed  mild acute and chronic inflammation, negative for H. pylori and intestinal metaplasia).  Multiple gastric polyps (gastric mucosa with foveolar hyperplasia).  Video capsule enteroscope placed successfully.  *** - Unable to tolerate iron pills due to severe constipation. - IV Monoferric on 10/13/2023 (with PREMEDICATION due to multiple medication allergies) - Received PRBC x 2 on 01/07/2024 due to Hgb 6.4.   - She has been on Eliquis since January 2025 for treatment of PE, and reports melanotic stool since starting Eliquis.  She stopped Eliquis toward the end of March 2025, and reports resolution of melena. - Most recent iron panel (01/06/2024): Ferritin 5, iron saturation 3%.  Normal creatinine. - CBC today (01/26/2024): Hgb 10.4/MCV 84.5. - Symptomatic with severe fatigue. - PLAN: Proceed with IV Monoferric x 1 to be given today - We will check stool cards x 3.  (If positive for blood, or with any recurrent melena, will refer to local GI.  *** - RTC  in 6 weeks, with same-day labs to include CBC/BB sample, ferritin, iron/TIBC.*** - If she has persistent anemia despite adequate iron stores, would consider additional workup in the future.    2.  Right-sided PE (January 2025) - Hospitalized at [facility] from 11/02/2023 through 11/08/2023 for sepsis related to labial/perirectal abscess, was also found to have small right-sided PE without right heart strain (CTA at [facility] on 11/03/2023) - Venous US negative for DVT in either lower extremity. - Treated with heparin while inpatient, and was started on Eliquis at discharge.  Hgb trend during hospitalization shows Hgb 12.7 on 11/03/2023, dropped to Hgb 9.8 on 11/08/2023. - Patient reports melanotic stool while taking Eliquis, and had severe iron deficiency anemia requiring blood transfusions - Patient self discontinued Eliquis toward the end of March 2025 when she ran out of her prescription and also "thought she could stop taking it since her CT scan from March did not show  the blood clot."   - RISK ANALYSIS: Pulmonary embolism thought to be weakly provoked in the setting of hospital admission for sepsis and cellulitis. NEGATIVE RISK FACTORS: Denies any preceding travel, injury, or recent surgeries.  She denies any history of hormone supplements, heart failure, IBD, diabetes, or CKD.  She is up-to-date on her age-appropriate cancer screenings.  No personal or family history of DVT, PE, or miscarriages. POSITIVE RISK FACTORS: Decreased mobility at baseline, and spends the majority of her day in a chair with her legs propped up, but reports that she gets up throughout the day and does not spend more than 2 hours sitting at a time.  Persistent risk factors for VTE include smoking, obesity, and hypertension.   - At today's visit (01/26/2024): Reports baseline dyspnea on exertion.  Denies any chest pain, hemoptysis, or palpitations.  Chronic intermittent bilateral edema of lower extremities, but no unilateral leg swelling. - D-dimer 2.27 at [facility] at the time of PE diagnosis (11/02/2023).  D-dimer today (01/26/2024) remains elevated, but improved at 1.75. - PLAN: *** INTERMEDIATE RISK of recurrent VTE, but with HIGH BLEEDING RISK *** - ***Only completed 2 months of anticoagulation *** - *** Restart Eliquis (what dose?)??  Or check CTA?? - *** Will check coagulopathy labs today.  ***  3.  History of  right lung mass & lung nodules - She reports that she had right apical lung mass in 2018.  PET scan at that time showed hypermetabolic lung mass with mediastinal, right hilar and right supraclavicular nodal disease.  However repeat CT scan few months later reportedly showed resolution of the right apical mass but she is being followed for lung nodules. - Most recent CT chest (01/06/2024) showed no suspicious pulmonary nodularities or masses.   No evidence of recurrent lung cancer or metastatic disease. - PLAN:  Due to current active tobacco use, high risk for lung cancer.  Continue with  annual CT chest (LDCT chest/LCS), next due March 2026.    4.  Social/family history: - Lives at home with her sister.  She is on 2 L/min oxygen by nasal cannula 24/7. - Current active smoker, 1 pack/day started at age 7. - Mother died of lung cancer.  Maternal aunt had lung cancer.  Maternal grandmother had liver cancer.  PLAN SUMMARY:*** >> IV Monoferric today >> Labs today = D-dimer and coagulopathy panel  >> Stool cards x 3 *** NEEDS ORDERS *** >> Same-day labs (CBC/D, BB sample, ferritin, iron/TIBC, D-dimer) + OFFICE visit in 6 weeks *** NEEDS ORDERS ***     REVIEW OF SYSTEMS: ***  Review of Systems  Constitutional:  Positive for fatigue. Negative for appetite change, chills, diaphoresis, fever and unexpected weight change.  HENT:   Negative for lump/mass and nosebleeds.   Eyes:  Negative for eye problems.  Respiratory:  Positive for cough. Negative for hemoptysis and shortness of breath.   Cardiovascular:  Negative for chest pain, leg swelling and palpitations.  Gastrointestinal:  Positive for constipation. Negative for abdominal pain, blood in stool, diarrhea, nausea and vomiting.  Genitourinary:  Negative for hematuria.   Skin: Negative.   Neurological:  Positive for headaches and numbness. Negative for dizziness and light-headedness.  Hematological:  Does not bruise/bleed easily.  Psychiatric/Behavioral:  Positive for sleep disturbance.      PHYSICAL EXAM:  ECOG PERFORMANCE STATUS: 2 - Symptomatic, <50% confined to bed  Vitals:   01/26/24 1254  BP: 128/60  Pulse: 84  Resp: 18  Temp: 98 F (36.7 C)  SpO2: 96%   Filed Weights   01/26/24 1254  Weight: 196 lb 13.9 oz (89.3 kg)   Physical Exam Constitutional:      Appearance: Normal appearance. She is obese.     Comments: Nasal cannula in place, on chronic supplemental oxygen  Cardiovascular:     Heart sounds: Normal heart sounds.  Pulmonary:     Breath sounds: Decreased air movement present. Decreased breath  sounds present.  Neurological:     General: No focal deficit present.     Mental Status: Mental status is at baseline.  Psychiatric:        Behavior: Behavior normal. Behavior is cooperative.     PAST MEDICAL/SURGICAL HISTORY:  Past Medical History:  Diagnosis Date   Arthritis    Cancer (HCC)    Lupus    Migraines    Past Surgical History:  Procedure Laterality Date   ABDOMINAL HYSTERECTOMY     CHOLECYSTECTOMY      SOCIAL HISTORY:  Social History   Socioeconomic History   Marital status: Widowed    Spouse name: Not on file   Number of children: Not on file   Years of education: Not on file   Highest education level: Not on file  Occupational History   Not on file  Tobacco Use  Smoking status: Every Day    Current packs/day: 1.00    Average packs/day: 1 pack/day for 54.3 years (54.3 ttl pk-yrs)    Types: Cigarettes    Start date: 77   Smokeless tobacco: Never  Substance and Sexual Activity   Alcohol use: No   Drug use: No   Sexual activity: Not on file  Other Topics Concern   Not on file  Social History Narrative   Not on file   Social Drivers of Health   Financial Resource Strain: Medium Risk (06/05/2023)   Received from [facility]   Overall Financial Resource Strain (CARDIA)    Difficulty of Paying Living Expenses: Somewhat hard  Food Insecurity: No Food Insecurity (11/07/2023)   Received from [facility]   Hunger Vital Sign    Worried About Running Out of Food in the Last Year: Never true    Ran Out of Food in the Last Year: Never true  Transportation Needs: No Transportation Needs (11/03/2023)   Received from [facility]   PRAPARE - Transportation    Lack of Transportation (Medical): No    Lack of Transportation (Non-Medical): No  Physical Activity: Inactive (11/07/2023)   Received from [facility]   Exercise Vital Sign    Days of Exercise per Week: 0 days    Minutes of Exercise per Session: 0 min  Stress:  Stress Concern Present (06/05/2023)   Received from [facility]   [employer] of Occupational Health - Occupational Stress Questionnaire    Feeling of Stress : To some extent  Social Connections: Moderately Integrated (11/07/2023)   Received from [facility]   Social Connection and Isolation Panel [NHANES]    Frequency of Communication with Friends and Family: More than three times a week    Frequency of Social Gatherings with Friends and Family: Twice a week    Attends Religious Services: More than 4 times per year    Active Member of [employer] or Organizations: Yes    Attends Banker Meetings: 1 to 4 times per year    Marital Status: Widowed  Intimate Partner Violence: Not At Risk (06/05/2023)   Received from [facility]   Humiliation, Afraid, Rape, and Kick questionnaire    Fear of Current or Ex-Partner: No    Emotionally Abused: No    Physically Abused: No    Sexually Abused: No    FAMILY HISTORY:  No family history on file.  CURRENT MEDICATIONS:  Outpatient Encounter Medications as of 01/26/2024  Medication Sig   albuterol (PROAIR HFA) 108 (90 Base) MCG/ACT inhaler Inhale 1-2 puffs into the lungs every 6 (six) hours as needed for wheezing or shortness of breath.    albuterol (PROAIR HFA) 108 (90 Base) MCG/ACT inhaler 2 puffs every 4 hours as needed only  if your can't catch your breath   atorvastatin (LIPITOR) 20 MG tablet Take 1 tablet by mouth daily.   azithromycin (ZITHROMAX) 250 MG tablet Take 2 on day one then 1 daily x 4 days   benzonatate (TESSALON) 100 MG capsule Take 1 capsule (100 mg total) by mouth every 8 (eight) hours.   BIOTIN PO Take 1 tablet by mouth daily.   BREZTRI AEROSPHERE 160-9-4.8 MCG/ACT AERO Inhale into the lungs.   budesonide-formoterol (SYMBICORT) 80-4.5 MCG/ACT inhaler Inhale 2 puffs into the lungs 2 (two) times daily.   busPIRone (BUSPAR) 10 MG tablet Take by mouth.   cetirizine (ZYRTEC)  10 MG tablet Take 10 mg by mouth daily.   Cholecalciferol (VITAMIN D3) 5000 units CAPS Take 1 capsule by mouth daily.   cyclobenzaprine (FLEXERIL) 5 MG tablet Take 5 mg by mouth at bedtime.   dexlansoprazole (DEXILANT) 60 MG capsule Take 60 mg by mouth daily.   docusate sodium (COLACE) 100 MG capsule Take 200 mg by mouth daily.   DULoxetine (CYMBALTA) 60 MG capsule Take 60 mg by mouth daily.   ELIQUIS 5 MG TABS tablet Take 5 mg by mouth 2 (two) times daily.   escitalopram (LEXAPRO) 5 MG tablet Take by mouth.   ferrous sulfate 325 (65 FE) MG tablet Take 1 tablet (325 mg total) by mouth daily.   folic acid (FOLVITE) 1 MG tablet Take 1 mg by mouth daily.   furosemide (LASIX) 20 MG tablet Take 1 tablet (20 mg total) by mouth daily.   hydroxychloroquine (PLAQUENIL) 200 MG tablet Take 200 mg by mouth 2 (two) times daily.   ipratropium-albuterol (DUONEB) 0.5-2.5 (3) MG/3ML SOLN Take 3 mLs by nebulization 4 (four) times daily.   montelukast (SINGULAIR) 10 MG tablet Take 10 mg by mouth at bedtime.   omeprazole (PRILOSEC) 40 MG capsule Take by mouth.   oxyCODONE-acetaminophen (PERCOCET/ROXICET) 5-325 MG tablet Take 1 tablet by mouth every 6 (six) hours as needed for severe pain (pain score 7-10).   predniSONE (DELTASONE) 5 MG tablet Take 5 mg by mouth daily with breakfast.   pregabalin (LYRICA) 75 MG capsule Take 75 mg by mouth 2 (two) times daily.   rizatriptan (MAXALT) 10 MG tablet Take 10 mg by mouth as needed for migraine.    SPIRIVA RESPIMAT 2.5 MCG/ACT AERS Inhale 1 spray into the lungs daily.   topiramate (TOPAMAX) 50 MG tablet Take 50 mg by mouth 2 (two) times daily.   traMADol (ULTRAM) 50 MG tablet Take 50 mg by mouth every 6 (six) hours as needed for moderate pain.   venlafaxine XR (EFFEXOR-XR) 150 MG 24 hr capsule Take 150 mg by mouth daily.   zolpidem (AMBIEN) 10 MG tablet Take 10 mg by mouth at bedtime.   [DISCONTINUED] gabapentin (NEURONTIN) 800 MG tablet Take 800 mg by mouth 4 (four)  times daily.   No facility-administered encounter medications on file as of 01/26/2024.    ALLERGIES:  Allergies  Allergen Reactions   Celecoxib Hives   Hydrocodone-Acetaminophen Itching    Other Reaction(s): analgesics-opioid   Oxycodone-Acetaminophen     Other Reaction(s): analgesics-opioid  rash   Roflumilast Itching    Other Reaction(s): antiasthmatic and bronchodilator agents   Sulfamethoxazole     Other Reaction(s): Allergy to sulfonamides  itching,hives   Sulfamethoxazole-Trimethoprim     Other Reaction(s): anti-infective agents-misc   Tramadol     itching   Leflunomide Itching and Rash    Other Reaction(s): analgesics-anti inflammatory  itching   Sulfa Antibiotics Rash   Sulfasalazine Rash    LABORATORY DATA:  I have reviewed the labs as listed.  CBC    Component Value Date/Time   WBC 8.2 01/26/2024 1423   RBC 4.01 01/26/2024 1423   HGB 10.1 (L) 01/26/2024 1423   HCT 34.0 (L) 01/26/2024 1423   PLT 332 01/26/2024 1423   MCV 84.8 01/26/2024 1423   MCH 25.2 (L) 01/26/2024 1423   MCHC 29.7 (L) 01/26/2024 1423   RDW 19.1 (H) 01/26/2024 1423   LYMPHSABS 1.9 01/26/2024 1157   MONOABS 0.7 01/26/2024 1157   EOSABS 0.1 01/26/2024 1157   BASOSABS 0.1 01/26/2024 1157  Latest Ref Rng & Units 01/06/2024   12:59 PM 10/23/2023    1:12 PM 09/28/2023    2:35 PM  CMP  Glucose 70 - 99 mg/dL 161  096  98   BUN 8 - 23 mg/dL 21  18  12    Creatinine 0.44 - 1.00 mg/dL 0.45  4.09  8.11   Sodium 135 - 145 mmol/L 136  135  136   Potassium 3.5 - 5.1 mmol/L 4.1  4.0  3.8   Chloride 98 - 111 mmol/L 101  100  102   CO2 22 - 32 mmol/L 26  26  25    Calcium 8.9 - 10.3 mg/dL 9.4  8.4  8.9   Total Protein 6.5 - 8.1 g/dL 6.7   6.8   Total Bilirubin 0.0 - 1.2 mg/dL 0.2   0.5   Alkaline Phos 38 - 126 U/L 54   75   AST 15 - 41 U/L 16   19   ALT 0 - 44 U/L 11   16     DIAGNOSTIC IMAGING:  I have independently reviewed the relevant imaging and discussed with the  patient.   WRAP UP:  All questions were answered. The patient knows to call the clinic with any problems, questions or concerns.  Medical decision making: Moderate  Time spent on visit: I spent 20 minutes counseling the patient face to face. The total time spent in the appointment was 30 minutes and more than 50% was on counseling.  Carnella Guadalajara, PA-C  ***

## 2024-01-26 ENCOUNTER — Inpatient Hospital Stay

## 2024-01-26 ENCOUNTER — Inpatient Hospital Stay (HOSPITAL_BASED_OUTPATIENT_CLINIC_OR_DEPARTMENT_OTHER): Admitting: Physician Assistant

## 2024-01-26 ENCOUNTER — Inpatient Hospital Stay: Attending: Hematology

## 2024-01-26 VITALS — BP 128/60 | HR 84 | Temp 98.0°F | Resp 18 | Wt 196.9 lb

## 2024-01-26 VITALS — BP 123/55 | HR 79 | Temp 97.8°F | Resp 18

## 2024-01-26 DIAGNOSIS — Z7952 Long term (current) use of systemic steroids: Secondary | ICD-10-CM | POA: Diagnosis not present

## 2024-01-26 DIAGNOSIS — K5730 Diverticulosis of large intestine without perforation or abscess without bleeding: Secondary | ICD-10-CM | POA: Diagnosis not present

## 2024-01-26 DIAGNOSIS — R5383 Other fatigue: Secondary | ICD-10-CM | POA: Insufficient documentation

## 2024-01-26 DIAGNOSIS — Z800 Family history of malignant neoplasm of digestive organs: Secondary | ICD-10-CM | POA: Insufficient documentation

## 2024-01-26 DIAGNOSIS — R519 Headache, unspecified: Secondary | ICD-10-CM | POA: Diagnosis not present

## 2024-01-26 DIAGNOSIS — Z862 Personal history of diseases of the blood and blood-forming organs and certain disorders involving the immune mechanism: Secondary | ICD-10-CM | POA: Diagnosis not present

## 2024-01-26 DIAGNOSIS — R0609 Other forms of dyspnea: Secondary | ICD-10-CM | POA: Diagnosis not present

## 2024-01-26 DIAGNOSIS — D508 Other iron deficiency anemias: Secondary | ICD-10-CM | POA: Diagnosis not present

## 2024-01-26 DIAGNOSIS — D649 Anemia, unspecified: Secondary | ICD-10-CM

## 2024-01-26 DIAGNOSIS — Z86718 Personal history of other venous thrombosis and embolism: Secondary | ICD-10-CM | POA: Diagnosis not present

## 2024-01-26 DIAGNOSIS — I2693 Single subsegmental pulmonary embolism without acute cor pulmonale: Secondary | ICD-10-CM

## 2024-01-26 DIAGNOSIS — R918 Other nonspecific abnormal finding of lung field: Secondary | ICD-10-CM | POA: Diagnosis not present

## 2024-01-26 DIAGNOSIS — D509 Iron deficiency anemia, unspecified: Secondary | ICD-10-CM | POA: Insufficient documentation

## 2024-01-26 DIAGNOSIS — Z79899 Other long term (current) drug therapy: Secondary | ICD-10-CM | POA: Diagnosis not present

## 2024-01-26 DIAGNOSIS — R609 Edema, unspecified: Secondary | ICD-10-CM | POA: Insufficient documentation

## 2024-01-26 DIAGNOSIS — Z7951 Long term (current) use of inhaled steroids: Secondary | ICD-10-CM | POA: Insufficient documentation

## 2024-01-26 DIAGNOSIS — Z801 Family history of malignant neoplasm of trachea, bronchus and lung: Secondary | ICD-10-CM | POA: Insufficient documentation

## 2024-01-26 DIAGNOSIS — K317 Polyp of stomach and duodenum: Secondary | ICD-10-CM | POA: Diagnosis not present

## 2024-01-26 DIAGNOSIS — K2950 Unspecified chronic gastritis without bleeding: Secondary | ICD-10-CM | POA: Insufficient documentation

## 2024-01-26 DIAGNOSIS — R7989 Other specified abnormal findings of blood chemistry: Secondary | ICD-10-CM | POA: Insufficient documentation

## 2024-01-26 DIAGNOSIS — K5900 Constipation, unspecified: Secondary | ICD-10-CM | POA: Diagnosis not present

## 2024-01-26 LAB — CBC WITH DIFFERENTIAL/PLATELET
Abs Immature Granulocytes: 0.03 10*3/uL (ref 0.00–0.07)
Basophils Absolute: 0.1 10*3/uL (ref 0.0–0.1)
Basophils Relative: 1 %
Eosinophils Absolute: 0.1 10*3/uL (ref 0.0–0.5)
Eosinophils Relative: 1 %
HCT: 35.5 % — ABNORMAL LOW (ref 36.0–46.0)
Hemoglobin: 10.4 g/dL — ABNORMAL LOW (ref 12.0–15.0)
Immature Granulocytes: 0 %
Lymphocytes Relative: 23 %
Lymphs Abs: 1.9 10*3/uL (ref 0.7–4.0)
MCH: 24.8 pg — ABNORMAL LOW (ref 26.0–34.0)
MCHC: 29.3 g/dL — ABNORMAL LOW (ref 30.0–36.0)
MCV: 84.5 fL (ref 80.0–100.0)
Monocytes Absolute: 0.7 10*3/uL (ref 0.1–1.0)
Monocytes Relative: 9 %
Neutro Abs: 5.4 10*3/uL (ref 1.7–7.7)
Neutrophils Relative %: 66 %
Platelets: 350 10*3/uL (ref 150–400)
RBC: 4.2 MIL/uL (ref 3.87–5.11)
RDW: 19.2 % — ABNORMAL HIGH (ref 11.5–15.5)
WBC: 8.2 10*3/uL (ref 4.0–10.5)
nRBC: 0 % (ref 0.0–0.2)

## 2024-01-26 LAB — SAMPLE TO BLOOD BANK

## 2024-01-26 LAB — CBC
HCT: 34 % — ABNORMAL LOW (ref 36.0–46.0)
Hemoglobin: 10.1 g/dL — ABNORMAL LOW (ref 12.0–15.0)
MCH: 25.2 pg — ABNORMAL LOW (ref 26.0–34.0)
MCHC: 29.7 g/dL — ABNORMAL LOW (ref 30.0–36.0)
MCV: 84.8 fL (ref 80.0–100.0)
Platelets: 332 10*3/uL (ref 150–400)
RBC: 4.01 MIL/uL (ref 3.87–5.11)
RDW: 19.1 % — ABNORMAL HIGH (ref 11.5–15.5)
WBC: 8.2 10*3/uL (ref 4.0–10.5)
nRBC: 0 % (ref 0.0–0.2)

## 2024-01-26 LAB — D-DIMER, QUANTITATIVE: D-Dimer, Quant: 1.75 ug{FEU}/mL — ABNORMAL HIGH (ref 0.00–0.50)

## 2024-01-26 MED ORDER — SODIUM CHLORIDE 0.9 % IV SOLN: INTRAVENOUS | Status: DC

## 2024-01-26 MED ORDER — FAMOTIDINE IN NACL 20-0.9 MG/50ML-% IV SOLN
20.0000 mg | Freq: Once | INTRAVENOUS | Status: AC
  Administered 2024-01-26: 20 mg via INTRAVENOUS
  Filled 2024-01-26: qty 50

## 2024-01-26 MED ORDER — SODIUM CHLORIDE 0.9 % IV SOLN
1000.0000 mg | Freq: Once | INTRAVENOUS | Status: AC
  Administered 2024-01-26: 1000 mg via INTRAVENOUS
  Filled 2024-01-26: qty 1000

## 2024-01-26 MED ORDER — CETIRIZINE HCL 10 MG/ML IV SOLN
10.0000 mg | Freq: Once | INTRAVENOUS | Status: AC
  Administered 2024-01-26: 10 mg via INTRAVENOUS
  Filled 2024-01-26: qty 1

## 2024-01-26 MED ORDER — METHYLPREDNISOLONE SODIUM SUCC 125 MG IJ SOLR
125.0000 mg | Freq: Once | INTRAMUSCULAR | Status: AC
  Administered 2024-01-26: 125 mg via INTRAVENOUS
  Filled 2024-01-26: qty 2

## 2024-01-26 NOTE — Progress Notes (Signed)
 Patient tolerated iron infusion well with no complaints voiced.  Patient left ambulatory with rolling walker in stable condition.  Vital signs stable at discharge.  Follow up as scheduled.

## 2024-01-26 NOTE — Patient Instructions (Signed)
 [facility] [place] - A DEPT OF [facility]  Discharge Instructions: Thank you for choosing [facility] to provide your oncology and hematology care.  If you have a lab appointment with the Cancer Center - please note that after April 8th, 2024, all labs will be drawn in the cancer center.  You do not have to check in or register with the main entrance as you have in the past but will complete your check-in in the cancer center.  Wear comfortable clothing and clothing appropriate for easy access to any Portacath or PICC line.   We strive to give you quality time with your provider. You may need to reschedule your appointment if you arrive late (15 or more minutes).  Arriving late affects you and other patients whose appointments are after yours.  Also, if you miss three or more appointments without notifying the office, you may be dismissed from the clinic at the provider's discretion.      For prescription refill requests, have your pharmacy contact our office and allow 72 hours for refills to be completed.    Today you received the following:  Monoferric.  Ferric Derisomaltose Injection What is this medication? FERRIC DERISOMALTOSE (FER ik der EYE soe MAWL tose) treats low levels of iron in your body (iron deficiency anemia). Iron is a mineral that plays an important role in making red blood cells, which carry oxygen from your lungs to the rest of your body. This medicine may be used for other purposes; ask your health care provider or pharmacist if you have questions. COMMON BRAND NAME(S): MONOFERRIC What should I tell my care team before I take this medication? They need to know if you have any of these conditions: High levels of iron in the blood An unusual or allergic reaction to iron, other medications, foods, dyes, or preservatives Pregnant or trying to get pregnant Breastfeeding How should I use this medication? This medication is injected into  a vein. It is given by your care team in a hospital or clinic setting. Talk to your care team about the use of this medication in children. Special care may be needed. Overdosage: If you think you have taken too much of this medicine contact a poison control center or emergency room at once. NOTE: This medicine is only for you. Do not share this medicine with others. What if I miss a dose? It is important not to miss your dose. Call your care team if you are unable to keep an appointment. What may interact with this medication? Do not take this medication with any of the following: Deferoxamine Dimercaprol Other iron products This list may not describe all possible interactions. Give your health care provider a list of all the medicines, herbs, non-prescription drugs, or dietary supplements you use. Also tell them if you smoke, drink alcohol, or use illegal drugs. Some items may interact with your medicine. What should I watch for while using this medication? Visit your care team for regular checks on your progress. Tell your care team if your symptoms do not start to get better or if they get worse. You may need blood work done while you are taking this medication. You may need to eat more foods that contain iron. Talk to your care team. Foods that contain iron include whole grains or cereals, dried fruits, beans, peas, leafy green vegetables, and organ meats (liver, kidney). What side effects may I notice from receiving this medication? Side  effects that you should report to your care team as soon as possible: Allergic reactions--skin rash, itching, hives, swelling of the face, lips, tongue, or throat Low blood pressure--dizziness, feeling faint or lightheaded, blurry vision Shortness of breath Side effects that usually do not require medical attention (report to your care team if they continue or are bothersome): Flushing Headache Joint pain Muscle pain Nausea Pain, redness, or  irritation at injection site This list may not describe all possible side effects. Call your doctor for medical advice about side effects. You may report side effects to FDA at 1-800-FDA-1088. Where should I keep my medication? This medication is given in a hospital or clinic. It will not be stored at home. NOTE: This sheet is a summary. It may not cover all possible information. If you have questions about this medicine, talk to your doctor, pharmacist, or health care provider.  2024 Elsevier/Gold Standard (2023-05-28 00:00:00)       To help prevent nausea and vomiting after your treatment, we encourage you to take your nausea medication as directed.  BELOW ARE SYMPTOMS THAT SHOULD BE REPORTED IMMEDIATELY: *FEVER GREATER THAN 100.4 F (38 C) OR HIGHER *CHILLS OR SWEATING *NAUSEA AND VOMITING THAT IS NOT CONTROLLED WITH YOUR NAUSEA MEDICATION *UNUSUAL SHORTNESS OF BREATH *UNUSUAL BRUISING OR BLEEDING *URINARY PROBLEMS (pain or burning when urinating, or frequent urination) *BOWEL PROBLEMS (unusual diarrhea, constipation, pain near the anus) TENDERNESS IN MOUTH AND THROAT WITH OR WITHOUT PRESENCE OF ULCERS (sore throat, sores in mouth, or a toothache) UNUSUAL RASH, SWELLING OR PAIN  UNUSUAL VAGINAL DISCHARGE OR ITCHING   Items with * indicate a potential emergency and should be followed up as soon as possible or go to the Emergency Department if any problems should occur.  Please show the CHEMOTHERAPY ALERT CARD or IMMUNOTHERAPY ALERT CARD at check-in to the Emergency Department and triage nurse.  Should you have questions after your visit or need to cancel or reschedule your appointment, please contact [facility] [place] - A DEPT OF Eligha [facility] [phone]  and follow the prompts.  Office hours are 8:00 a.m. to 4:30 p.m. Monday - Friday. Please note that voicemails left after 4:00 p.m. may not be returned until the following business day.  We are closed weekends  and major holidays. You have access to a nurse at all times for urgent questions. Please call the main number to the clinic [phone] and follow the prompts.  For any non-urgent questions, you may also contact your provider using MyChart. We now offer e-Visits for anyone 70 and older to request care online for non-urgent symptoms. For details visit [email or web address].   Also download the MyChart app! Go to the app store, search "MyChart", open the app, select [place], and log in with your MyChart username and password.

## 2024-01-26 NOTE — Patient Instructions (Signed)
 [facility] at [facility] **VISIT SUMMARY & IMPORTANT INSTRUCTIONS **   You were seen today by Rojelio Brenner PA-C for your follow-up visit.    IRON DEFICIENCY ANEMIA I suspect that you have had chronic blood loss from your stomach and intestines, which was made worse by being on a blood thinner since January 2025. You will receive IV iron today. I will see you for repeat labs and follow-up in 6 weeks. I would also like to check stool cards x 3 to see if you have any blood in your bowel movements.  BLOOD CLOT IN YOUR LUNGS ("PULMONARY EMBOLISM"): You had a blood clot in your lungs during your hospitalization at [facility].  This may have been mildly provoked by your hospital stay. You are at intermediate (moderate) risk of having a blood clot again in the future, but are at high risk of bleeding. In these cases, most experts recommend stopping anticoagulation (blood thinner) if possible. We will check additional labs (D-dimer) to see if it safe for you to stop blood thinners at this time.  If your D-dimer is elevated, we would consider restarting you on low-dose blood thinner.  FOLLOW-UP APPOINTMENT: 6 weeks  ** Thank you for trusting me with your healthcare!  I strive to provide all of my patients with quality care at each visit.  If you receive a survey for this visit, I would be so grateful to you for taking the time to provide feedback.  Thank you in advance!  ~ Latanza Pfefferkorn                   Dr. Doreatha Massed   &   Rojelio Brenner, PA-C   - - - - - - - - - - - - - - - - - -    Thank you for choosing [facility] at [facility] to provide your oncology and hematology care.  To afford each patient quality time with our provider, please arrive at least 15 minutes before your scheduled appointment time.   If you have a lab appointment with the Cancer Center please come in thru the Main Entrance and check in at the main information desk.  You  need to re-schedule your appointment should you arrive 10 or more minutes late.  We strive to give you quality time with our providers, and arriving late affects you and other patients whose appointments are after yours.  Also, if you no show three or more times for appointments you may be dismissed from the clinic at the providers discretion.     Again, thank you for choosing [facility].  Our hope is that these requests will decrease the amount of time that you wait before being seen by our physicians.       _____________________________________________________________  Should you have questions after your visit to [facility], please contact our office at [phone] and follow the prompts.  Our office hours are 8:00 a.m. and 4:30 p.m. Monday - Friday.  Please note that voicemails left after 4:00 p.m. may not be returned until the following business day.  We are closed weekends and major holidays.  You do have access to a nurse 24-7, just call the main number to the clinic [phone] and do not press any options, hold on the line and a nurse will answer the phone.    For prescription refill requests, have your pharmacy contact our office and allow 72  hours.

## 2024-01-26 NOTE — Progress Notes (Signed)
 Patient presents today for Monoferric infusion per providers order.  Vital signs WNL.  Patient has no new complaints at this time.    Peripheral IV started and blood return noted pre and

## 2024-01-27 ENCOUNTER — Encounter: Payer: Self-pay | Admitting: Hematology

## 2024-01-27 ENCOUNTER — Ambulatory Visit (HOSPITAL_COMMUNITY)
Admission: RE | Admit: 2024-01-27 | Discharge: 2024-01-27 | Disposition: A | Discharge status: Home / Self Care | Payer: Medicare Other | Source: Ambulatory Visit | Attending: Internal Medicine | Admitting: Internal Medicine

## 2024-01-27 ENCOUNTER — Telehealth: Payer: Self-pay | Admitting: Physician Assistant

## 2024-01-27 DIAGNOSIS — J449 Chronic obstructive pulmonary disease, unspecified: Secondary | ICD-10-CM | POA: Diagnosis present

## 2024-01-27 DIAGNOSIS — J9611 Chronic respiratory failure with hypoxia: Secondary | ICD-10-CM | POA: Insufficient documentation

## 2024-01-27 LAB — PULMONARY FUNCTION TEST
DL/VA % pred: 53 %
DL/VA: 2.2 ml/min/mmHg/L
DLCO unc % pred: 41 %
DLCO unc: 8.33 ml/min/mmHg
FEF 25-75 Post: 1.17 L/s
FEF 25-75 Pre: 2.62 L/s
FEF2575-%Change-Post: -55 %
FEF2575-%Pred-Post: 61 %
FEF2575-%Pred-Pre: 138 %
FEV1-%Change-Post: -12 %
FEV1-%Pred-Post: 84 %
FEV1-%Pred-Pre: 96 %
FEV1-Post: 1.98 L
FEV1-Pre: 2.27 L
FEV1FVC-%Change-Post: 3 %
FEV1FVC-%Pred-Pre: 108 %
FEV6-%Change-Post: -14 %
FEV6-%Pred-Post: 78 %
FEV6-%Pred-Pre: 92 %
FEV6-Post: 2.34 L
FEV6-Pre: 2.74 L
FEV6FVC-%Change-Post: 0 %
FEV6FVC-%Pred-Post: 104 %
FEV6FVC-%Pred-Pre: 104 %
FVC-%Change-Post: -15 %
FVC-%Pred-Post: 75 %
FVC-%Pred-Pre: 88 %
FVC-Post: 2.34 L
FVC-Pre: 2.76 L
Post FEV1/FVC ratio: 85 %
Post FEV6/FVC ratio: 100 %
Pre FEV1/FVC ratio: 82 %
Pre FEV6/FVC Ratio: 99 %
RV % pred: 86 %
RV: 2 L
TLC % pred: 85 %
TLC: 4.53 L

## 2024-01-27 LAB — LUPUS ANTICOAGULANT PANEL
DRVVT: 37 s (ref 0.0–47.0)
PTT Lupus Anticoagulant: 34.2 s (ref 0.0–43.5)

## 2024-01-27 MED ORDER — ALBUTEROL SULFATE (2.5 MG/3ML) 0.083% IN NEBU
2.5000 mg | INHALATION_SOLUTION | Freq: Once | RESPIRATORY_TRACT | Status: AC
  Administered 2024-01-27: 2.5 mg via RESPIRATORY_TRACT

## 2024-01-27 NOTE — Telephone Encounter (Signed)
 Attempt #1 to contact patient to discuss updated plan (per discussion with Dr. Ellin Saba on 01/27/2024), since this was left open ended during patient visit on 01/26/2024.  No answer.  Left message.  Will try again tomorrow.

## 2024-01-28 ENCOUNTER — Encounter: Payer: Self-pay | Admitting: Hematology

## 2024-01-28 LAB — CARDIOLIPIN ANTIBODIES, IGG, IGM, IGA
Anticardiolipin IgA: <9 U/mL (ref 0–11)
Anticardiolipin IgG: <9 GPL U/mL (ref 0–14)
Anticardiolipin IgM: 11 [MPL'U]/mL (ref 0–12)

## 2024-01-28 LAB — BETA-2-GLYCOPROTEIN I ABS, IGG/M/A
Beta-2 Glyco I IgG: <9 GPI IgG units (ref 0–20)
Beta-2-Glycoprotein I IgA: <9 GPI IgA units (ref 0–25)
Beta-2-Glycoprotein I IgM: <9 GPI IgM units (ref 0–32)

## 2024-01-28 NOTE — Telephone Encounter (Signed)
 Discussed with patient Carmen Hansen) that due to elevated D-dimer we would like to check CTA chest to see if she has any residual blood clot.  Since she is at high risk of recurrent bleeding, it is prudent to determine whether or not she truly needs to continue anticoagulation at this point.  Patient is agreeable with the above, and we will proceed with scheduling her for CTA chest at next available.  Further plan regarding anticoagulation TBD based on CTA chest results.  Rojelio Brenner PA-C 01/28/2024 11:58 AM

## 2024-01-29 ENCOUNTER — Ambulatory Visit (HOSPITAL_COMMUNITY)
Admission: RE | Admit: 2024-01-29 | Discharge: 2024-01-29 | Disposition: A | Discharge status: Home / Self Care | Source: Ambulatory Visit | Attending: Physician Assistant | Admitting: Physician Assistant

## 2024-01-29 DIAGNOSIS — I2693 Single subsegmental pulmonary embolism without acute cor pulmonale: Secondary | ICD-10-CM

## 2024-01-29 MED ORDER — IOHEXOL 350 MG/ML SOLN
75.0000 mL | Freq: Once | INTRAVENOUS | Status: AC | PRN
  Administered 2024-01-29: 75 mL via INTRAVENOUS

## 2024-01-30 LAB — PROTHROMBIN GENE MUTATION

## 2024-01-30 LAB — FACTOR 5 LEIDEN

## 2024-02-03 ENCOUNTER — Encounter: Payer: Self-pay | Admitting: Physician Assistant

## 2024-02-03 ENCOUNTER — Telehealth: Payer: Self-pay

## 2024-02-03 ENCOUNTER — Encounter: Payer: Self-pay | Admitting: Internal Medicine

## 2024-02-03 NOTE — Telephone Encounter (Signed)
 Called pt,no answer,LMTCB,left mychart msg

## 2024-02-03 NOTE — Telephone Encounter (Signed)
-----   Message from Vernestine Gondola sent at 02/03/2024  7:33 AM EDT ----- Call pt:  Reviewed  study and does not show significant copd  despite mild emphysema on CT so main rec is stop smoking/ no change in meds for now  Be sure patient has/keeps f/u ov so we can go over all the details of this study and get a plan together moving forward - ok to move up f/u if not feeling better and wants to be seen sooner   Copy to pcp

## 2024-02-04 NOTE — Telephone Encounter (Signed)
 Copied from CRM [id number]. Topic: Clinical - Lab/Test Results >> Feb 03, 2024  1:19 PM Isabell A wrote: Reason for CRM: Patient returning phone call in regard to her PFT results, please call patient back.  ATC x2 LVM for patient to call our office back

## 2024-02-05 NOTE — Progress Notes (Signed)
Spoke with pt and notified of results per Dr. Wert. Pt verbalized understanding and denied any questions. 

## 2024-02-19 NOTE — Progress Notes (Addendum)
 Carmen Hansen, female    DOB: [birth year]    MRN: [id number]   Brief patient profile:  26  yowf  active smoker  referred to pulmonary clinic in [place]  11/25/2023 by EDP- [facility]  for copd pred dep since 1999 / 02 dep x 2015    Discharge date: November 08, 2023 Length of stay: LOS: 5 days   Admission HPI   Patient admitted on: 11/02/2023 10:50 PM   CHIEF COMPLAINT: Fever/chills shortness of breath  Day of admission HPI:   This is a 72 y.o. female with a known history of RA, asthma/COPD on 2L home O2, HTN, HLD, lung cancer in remission, depression, anxiety, GERD, insomnia, prediabetes presents to the emergency department for evaluation of two week history of worsening SOB associated with cough and fever to 103 at home. In ER she was placed on BiPAP, was found to meet sepsis criteria and have a small PE without right heart strain. She is feeling better with BiPAP  In the ED the patient received cefepime,vancomycin, heparin, duoneb, methylpred. Medical admission was requested for further workup and management of acute on chronic hypoxic respiratory failure, PE, sepsis. She was admitted as an inpatient to the hospital service and initiated on IV heparin for her PE. Patient was also seen by general surgery for her perirectal abscess, status post I&D patient was admitted for sepsis right labial cellulitis.   She was initiated on broad-spectrum antibiotics this was de-escalated to Augmentin. Her respiratory status was closely monitored in the ICU. Patient was transition from IV heparin to Eliquis on the day of discharge she was hemodynamically stable and seen by general surgery patient is to follow-up with primary care physician and general surgery as scheduled. She is currently on Eliquis for her PE and p.o. Augmentin All acute and chronic comorbid's were addressed risk of decompensation mortality morbidity and death is moderate to high if the patient does not follow recommendations medications  follow-up appointments and compliance  Patient admitted on Home O2? -Yes Patient on home anticoagulant? -No Patient admitted with Chronic home foley catheter? - no Foley catheter placed or replaced by another service prior to admission? 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 No change in medications  Make sure you check your oxygen saturation  AT  your highest level of activity (not after you stop)   to be sure it stays over 90%  The key is to stop smoking completely before smoking completely stops you!  Please schedule a follow up office visit in 6 weeks, call sooner if needed - bring inhalers     01/13/2024  f/u ov/[place] office/Carmen Hansen re: GOLD 0 copd/ AB  clinically and mild emphysema on CT maint on Breztri   Chief Complaint  Patient presents with   Follow-up    Follow up for copd, when to the hospital  for a blood transfusion , she was taking eliquis 5mg  but she has taking in 1 week because she is out of refills.  Dyspnea: walking  room to room at home better  Cough: moderately congested > slt yellow  Sleeping: flat bed bed 2 pillows s    resp cc  SABA use: doesn't have one  02: 2lpm hs and prn daytime Rec Zpak for your congested cough  Proaire (called in = albuterol ) Only use your albuterol   as a rescue medication Also  Ok to try albuterol  15 min before an activity (on alternating days)  that you know would usually make you short of breath Work on inhaler technique:   Make sure you check your oxygen saturation  AT  your highest level of activity (not after you stop)   to be sure it stays over 90%   The key is to stop smoking completely before smoking completely stops you!  Please schedule a follow up visit in 3 months but call sooner if needed with PFTs on return     .02/20/2024  f/u ov/ office/Carmen Hansen re: GOLD 0 copd/ AB clinically and mild emphysema on CT maint on Breztri   Chief Complaint  Patient presents with   COPD   Shortness of Breath  Dyspnea:  walking s rollator  Cough: smoker's rattle worse p supper / min mucoid production  Sleeping: flat bed 2 pillow    resp cc  SABA use: rarely hfa/ neb only for exac 02: 2lpm hs and prn daytime with sats 93%   No obvious day to day or daytime variability or assoc excess/ purulent sputum or mucus plugs or hemoptysis or cp or chest tightness, subjective wheeze or overt sinus or hb symptoms.    Also denies any obvious fluctuation of symptoms with weather or environmental changes or other aggravating or alleviating factors except as outlined above   No unusual exposure hx or h/o childhood pna/ asthma or knowledge of premature birth.  Current Allergies, Complete Past Medical History, Past Surgical History, Family History, and Social History were reviewed in [employer] record.  ROS  The following are not active complaints unless bolded Hoarseness, sore throat, dysphagia, dental problems, itching, sneezing,  nasal congestion or discharge of excess mucus or purulent secretions, ear ache,   fever, chills, sweats, unintended wt loss or wt gain, classically pleuritic or exertional cp,  orthopnea pnd or arm/hand swelling  or leg swelling, presyncope, palpitations, abdominal pain, anorexia, nausea, vomiting,  diarrhea  or change in bowel habits or change in bladder habits, change in stools or change in urine, dysuria, hematuria,  rash, arthralgias, visual complaints, headache, numbness, weakness or ataxia or problems with walking/ rollator to go out or coordination,  change in mood or  memory.        Current Meds  Medication Sig   albuterol  (PROAIR  HFA) 108 (90 Base) MCG/ACT inhaler 2 puffs every 4 hours as needed only  if your can't catch your breath   atorvastatin (LIPITOR) 20 MG tablet Take 1 tablet by mouth daily.   Baclofen 5 MG TABS Take by mouth.   benzonatate  (TESSALON ) 100 MG capsule Take 1 capsule (100 mg total) by mouth every 8 (eight) hours.   BIOTIN PO Take 1 tablet by mouth daily.   BREZTRI AEROSPHERE 160-9-4.8  MCG/ACT AERO Inhale into the lungs.   budesonide-formoterol (SYMBICORT) 80-4.5 MCG/ACT inhaler Inhale 2 puffs into the lungs 2 (two) times daily.   busPIRone (BUSPAR) 10 MG tablet Take by mouth.   cetirizine  (ZYRTEC ) 10 MG tablet Take 10 mg by mouth daily.   Cholecalciferol (VITAMIN D3) 5000 units CAPS Take 1 capsule by mouth daily.   cyclobenzaprine (FLEXERIL) 5 MG tablet Take 5 mg by mouth at bedtime.   dexlansoprazole (DEXILANT) 60 MG capsule Take 60 mg by mouth daily.   docusate sodium (COLACE) 100 MG capsule Take 200 mg by mouth daily.   DULoxetine (CYMBALTA) 60 MG capsule Take 60 mg by mouth daily.   ELIQUIS 5 MG TABS tablet Take 5 mg by mouth 2 (two) times daily.   escitalopram (LEXAPRO) 5 MG tablet Take by mouth.   ferrous sulfate  325 (65 FE) MG tablet Take 1 tablet (325 mg total) by mouth daily.   folic acid  (FOLVITE ) 1 MG tablet Take 1 mg by mouth daily.   furosemide  (LASIX ) 20 MG tablet Take 1 tablet (20 mg total) by mouth daily.   hydroxychloroquine (PLAQUENIL) 200 MG tablet Take 200 mg by mouth 2 (two) times daily.   ipratropium-albuterol  (DUONEB) 0.5-2.5 (3) MG/3ML SOLN Take 3 mLs by nebulization 4 (four) times daily.   montelukast (SINGULAIR) 10 MG tablet Take  10 mg by mouth at bedtime.   omeprazole (PRILOSEC) 40 MG capsule Take by mouth.   oxyCODONE -acetaminophen  (PERCOCET/ROXICET) 5-325 MG tablet Take 1 tablet by mouth every 6 (six) hours as needed for severe pain (pain score 7-10).   predniSONE (DELTASONE) 5 MG tablet Take 5 mg by mouth daily with breakfast.   pregabalin (LYRICA) 75 MG capsule Take 75 mg by mouth 2 (two) times daily.   rizatriptan (MAXALT) 10 MG tablet Take 10 mg by mouth as needed for migraine.    SPIRIVA RESPIMAT 2.5 MCG/ACT AERS Inhale 1 spray into the lungs daily.   topiramate (TOPAMAX) 50 MG tablet Take 50 mg by mouth 2 (two) times daily.   traMADol (ULTRAM) 50 MG tablet Take 50 mg by mouth every 6 (six) hours as needed for moderate pain.   venlafaxine XR (EFFEXOR-XR) 150 MG 24 hr capsule Take 150 mg by mouth daily.   zolpidem (AMBIEN) 10 MG tablet Take 10 mg by mouth at bedtime.         Past Medical History:  Diagnosis Date   Arthritis    Cancer (HCC)    Lupus    Migraines       Objective:    Wts   02/20/2024         194       11/25/23 202 lb 12.8 oz (92 kg)  10/08/23 215 lb 12.8 oz (97.9 kg)  09/28/23 221 lb (100.2 kg)    Vital signs reviewed  02/20/2024  - Note at rest 02 sats  95% on 2lpm POC    General appearance:    amb (with rollator) amb wf / rattle   HEENT : Oropharynx  clear   Nasal turbinates nl    NECK :  without  apparent JVD/ palpable Nodes/TM    LUNGS: no acc muscle use,  Min barrel  contour chest wall with bilateral  slightly decreased bs s audible wheeze and  without cough on insp or exp maneuvers and min  Hyperresonant  to  percussion bilaterally    CV:  RRR  no s3 or murmur or increase in P2, and no edema  ABD:  obese soft and nontender    MS:   ext warm without deformities Or obvious joint restrictions  calf tenderness, cyanosis or clubbing     SKIN: warm and dry without lesions    NEURO:  alert, approp, nl sensorium with  no motor or cerebellar deficits apparent.                  I personally reviewed images and agree with radiology impression as follows:   Chest CT 3//18/25   w contrast   Emphysema - mild to moderate  CTa 01/29/24 No definite evidence of pulmonary embolus.  Mild mosaic pattern is noted in the upper lobes suggesting at trapping secondary to small airways disease or potentially minimal multifocal inflammation.      Assessment

## 2024-02-20 ENCOUNTER — Encounter: Payer: Self-pay | Admitting: Internal Medicine

## 2024-02-20 ENCOUNTER — Ambulatory Visit: Admitting: Internal Medicine

## 2024-02-20 VITALS — BP 136/80 | HR 82 | Ht 65.0 in | Wt 194.0 lb

## 2024-02-20 DIAGNOSIS — J449 Chronic obstructive pulmonary disease, unspecified: Secondary | ICD-10-CM | POA: Diagnosis not present

## 2024-02-20 DIAGNOSIS — F17210 Nicotine dependence, cigarettes, uncomplicated: Secondary | ICD-10-CM

## 2024-02-20 DIAGNOSIS — E7800 Pure hypercholesterolemia, unspecified: Secondary | ICD-10-CM | POA: Insufficient documentation

## 2024-02-20 DIAGNOSIS — J9611 Chronic respiratory failure with hypoxia: Secondary | ICD-10-CM

## 2024-02-20 DIAGNOSIS — D649 Anemia, unspecified: Secondary | ICD-10-CM | POA: Insufficient documentation

## 2024-02-20 NOTE — Assessment & Plan Note (Signed)
 4-5 min discussion re active cigarette smoking in addition to office E&M  Ask about tobacco use:   ongoing Advise quitting   I took an extended  opportunity with this patient to outline the consequences of continued cigarette use  in airway disorders based on all the data we have from the multiple national lung health studies (perfomed over decades at millions of dollars in cost)  indicating that smoking cessation, not choice of inhalers or pulmonary physicians, is the most important aspect of her care.   Assess willingness:  Not committed at this point Assist in quit attempt:  Per PCP when ready Arrange follow up:   Follow up per Primary Care planned   0

## 2024-02-20 NOTE — Patient Instructions (Addendum)
 Work on inhaler technique:  relax and gently blow all the way out then take a nice smooth full deep breath back in, triggering the inhaler at same time you start breathing in.  Hold breath in for at least  5 seconds if you can. Blow out breztri  thru nose. Rinse and gargle with water when done.  If mouth or throat bother you at all,  try brushing teeth/gums/tongue with arm and hammer toothpaste/ make a slurry and gargle and spit out.   Make sure you check your oxygen saturation  AT  your highest level of activity (not after you stop)   to be sure it stays over 90% and adjust  02 flow upward to maintain this level if needed but remember to turn it back to previous settings when you stop (to conserve your supply).  Try add pepcid  20 mg after supper for a month to see if helps cough and eat a small supper    Please schedule a follow up visit in 6  months but call sooner if needed

## 2024-02-20 NOTE — Assessment & Plan Note (Addendum)
 Active smoker - CT chest w contrast 01/06/24 mild/ mod emphysema - 01/13/2024  After extensive coaching inhaler device,  effectiveness =    75% (short ti) - PFT's  01/27/24  FEV1 2.27 (96 % ) ratio 0.82  p 0 % improvement from saba p 0 prior to study with DLCO  8.33  (41%)   and FV curve nl  and ERV 48%  at wt 196     - The proper method of use, as well as anticipated side effects, of a metered-dose inhaler were discussed and demonstrated to the patient using teach back method.    Group D (now reclassified as E) in terms of symptom/risk and laba/lama/ICS  therefore appropriate rx at this point >>>  breztri 2bid and prn saba  >>> also add pepcid  20 mg p supper and eat smaller meal to see if helps cough

## 2024-02-20 NOTE — Assessment & Plan Note (Addendum)
 On 02 since 2015  2lpm hs and prn   Advised Make sure you check your oxygen saturation  AT  your highest level of activity (not after you stop)   to be sure it stays over 90% and adjust  02 flow upward to maintain this level if needed but remember to turn it back to previous settings when you stop (to conserve your supply).   F/u q 6m, sooner prn  Each maintenance medication was reviewed in detail including emphasizing most importantly the difference between maintenance and prns and under what circumstances the prns are to be triggered using an action plan format where appropriate.  Total time for H and P, chart review, counseling, reviewing hfa/02/pulse ox  device(s) , directly observing portions of ambulatory 02 saturation study/ and generating customized AVS unique to this office visit / same day charting = 24 min

## 2024-03-04 ENCOUNTER — Encounter: Payer: Self-pay | Admitting: Emergency Medicine

## 2024-03-08 ENCOUNTER — Telehealth: Payer: Self-pay | Admitting: Internal Medicine

## 2024-03-08 DIAGNOSIS — J449 Chronic obstructive pulmonary disease, unspecified: Secondary | ICD-10-CM

## 2024-03-08 MED ORDER — ALBUTEROL SULFATE HFA 108 (90 BASE) MCG/ACT IN AERS: 2.0000 | INHALATION_SPRAY | Freq: Four times a day (QID) | RESPIRATORY_TRACT | 3 refills | Status: DC | PRN

## 2024-03-08 NOTE — Telephone Encounter (Signed)
 Refill request from [facility]   Albuterol  Sulfate HFA 90 MCG/Actuation Aerosol Inhaler , please provide strength, directions, quantity and refills     Pharmacy phone : [phone]  fax [phone]

## 2024-03-08 NOTE — Telephone Encounter (Signed)
 Rx sent to pharmacy

## 2024-03-09 NOTE — Progress Notes (Signed)
 [facility] [address] [place] [address]   CLINIC:  Medical Oncology/Hematology  PCP:  Eilene Grater, MD [address] [place] [address] [phone]    REASON FOR VISIT:  Follow-up for iron deficiency anemia and right lung mass  CURRENT THERAPY: IV iron infusions + PRBC transfusions as needed  INTERVAL HISTORY:   Carmen Hansen 73 y.o. female returns for routine follow-up of iron deficiency anemia and right lung mass.  She was last seen by Sheril Dines PA-C on 01/26/2024.  She received IV Monoferric  on 01/26/2024.  She is accompanied today by her daughter, Carmen Hansen.  IRON DEFICIENCY ANEMIA: She felt improved energy after her IV iron and April 2025, but has had some recurrent fatigue over the past 2 weeks.  Her dyspnea on exertion has improved and is back to baseline.  She has chronic headaches and occasional dizziness/feeling off balance.  She denies any ice pica, lightheadedness, syncope, or chest pain.  She has not noticed any recurrent rectal bleeding or melena since stopping her Eliquis in March 2025.  PULMONARY EMBOLISM: She has remained off of Eliquis, after her CT scan from April 2025 showed no evidence of recurrent PE.   She reports baseline dyspnea on exertion and denies any chest pain, hemoptysis, or palpitations.  She has chronic intermittent bilateral edema of lower extremities, but denies any unilateral leg swelling.  She has 25% energy and 50% appetite. She endorses that she is maintaining a stable weight.  ASSESSMENT & PLAN:  1.  Severe iron deficiency anemia: - Referral from ER for abnormal labs on 09/28/2023, hemoglobin 8.1, ferritin 6 - Per patient, has had unexplained iron deficiency anemia since around 2022, has received IV iron infusions in [place]  prior to moving to [place] . - Records received via [facility] in [place], [place] Colonoscopy (07/19/2022): Significant fixed looping of colon with moderate to severe diverticulosis.   No bleeding noted. Upper GI endoscopy (07/19/2022): Chronic gastritis characterized by congestion and erythema in the entire examined stomach (pathology showed mild acute and chronic inflammation, negative for H. pylori and intestinal metaplasia).  Multiple gastric polyps (gastric mucosa with foveolar hyperplasia).  Video capsule enteroscope placed successfully.   ** Video capsule report requested, but not located by hospital in [facility] ** - Unable to tolerate iron pills due to severe constipation. - Most recent IV Monoferric  on 01/26/2024 (with PREMEDICATION due to multiple medication allergies) - Received PRBC x 2 on 01/07/2024 due to Hgb 6.4.   - She reported melanotic stool after starting Eliquis in January 2025 (for treatment of PE) - Stopped Eliquis toward the end of March 2025, and reports resolution of melena. - She denies any recent rectal bleeding or melena. - Stool cards not yet returned by patient - Labs today (03/10/2024): Hgb 15.1/MCV 92.2.  Ferritin 32, iron saturation 19%. Mild erythrocytosis noted.  Likely secondary to tobacco use and mild dehydration.  Would consider additional workup if progressively elevated in the future. - Symptomatic with fatigue. - PLAN: Recommend IV Feraheme x 1 - RTC  in 3 months - Regarding erythrocytosis, patient was given information smoking cessation counseling and was encouraged to increase her water intake.  2.  Right-sided PE (January 2025) - Hospitalized at [facility] from 11/02/2023 through 11/08/2023 for sepsis related to labial/perirectal abscess, was also found to have small right-sided PE without right heart strain (CTA at [facility] on 11/03/2023) - Venous US  negative for DVT in either lower extremity. - Treated with heparin while inpatient, and  was started on Eliquis at discharge.  Hgb trend during hospitalization shows Hgb 12.7 on 11/03/2023, dropped to Hgb 9.8 on 11/08/2023. - Patient reports melanotic stool while taking Eliquis, and had severe iron deficiency  anemia requiring blood transfusions - Patient self discontinued Eliquis toward the end of March 2025 when she ran out of her prescription and also "thought she could stop taking it since her CT scan from March did not show the blood clot."   - CTA chest (01/29/2024): No definite evidence of pulmonary embolus. - RISK ANALYSIS - Patient is a INTERMEDIATE RISK of recurrent VTE Pulmonary embolism thought to be weakly provoked in the setting of hospital admission for sepsis and cellulitis. NEGATIVE RISK FACTORS: Denies any preceding travel, injury, or recent surgeries.  She denies any history of hormone supplements, heart failure, IBD, diabetes, or CKD.  She is up-to-date on her age-appropriate cancer screenings.  No personal or family history of DVT, PE, or miscarriages. POSITIVE RISK FACTORS: Decreased mobility at baseline, and spends the majority of her day in a chair with her legs propped up, but reports that she gets up throughout the day and does not spend more than 2 hours sitting at a time.  Persistent risk factors for VTE include smoking, obesity, and hypertension.   - Although patient only completed 2 months of anticoagulation for her PE, CTA chest from 01/29/2024 showed resolution of pulmonary embolism.  She also has high risk of severe and potentially life-threatening bleeding events.  Per discussion with Dr. Katragadda (01/27/2024), we recommend against indefinite anticoagulation.  Although she is intermediate risk of recurrent VTE, her high bleeding risk poses a higher danger. - At today's visit (03/11/24): Reports baseline dyspnea on exertion.  Denies any chest pain, hemoptysis, or palpitations.  Chronic intermittent bilateral edema of lower extremities, but no unilateral leg swelling. - D-dimer 2.27 at [facility] at the time of PE diagnosis (11/02/2023).  D-dimer (01/26/2024) remains elevated, but improved at 1.75. - D-dimer today (03/10/2024) trending downward at 1.50 - PLAN: As noted above, recommend  against indefinite anticoagulation. - Will start her on aspirin 81 mg daily. - Patient provided education on "alarm symptoms" that would prompt immediate medical attention.  3.  History of right lung mass & lung nodules - She reports that she had right apical lung mass in 2018.  PET scan at that time showed hypermetabolic lung mass with mediastinal, right hilar and right supraclavicular nodal disease.  However repeat CT scan few months later reportedly showed resolution of the right apical mass but she is being followed for lung nodules. - Most recent CT chest (01/06/2024) showed no suspicious pulmonary nodularities or masses.   No evidence of recurrent lung cancer or metastatic disease. - PLAN:  Due to current active tobacco use, high risk for lung cancer.  Continue with annual CT chest (LDCT chest/LCS), next due March 2026.    4.  Social/family history: - Lives at home with her sister.  She is on 2 L/min oxygen by nasal cannula 24/7. - Current active smoker, 1 pack/day started at age 65. - Mother died of lung cancer.  Maternal aunt had lung cancer.  Maternal grandmother had liver cancer.  PLAN SUMMARY:  >> IV Feraheme x 1 >> Labs in 3 months = CBC/D, ferritin, iron/TIBC >> OFFICE visit in 3 months (1 week after labs)     REVIEW OF SYSTEMS:   Review of Systems  Constitutional:  Positive for fatigue. Negative for appetite change, chills, diaphoresis, fever and unexpected weight  change.  HENT:   Negative for lump/mass and nosebleeds.   Eyes:  Negative for eye problems.  Respiratory:  Positive for shortness of breath (baseline DOE). Negative for cough and hemoptysis.   Cardiovascular:  Negative for chest pain, leg swelling and palpitations.  Gastrointestinal:  Negative for abdominal pain, blood in stool, constipation, diarrhea, nausea and vomiting.  Genitourinary:  Negative for hematuria.   Skin: Negative.   Neurological:  Positive for dizziness and headaches. Negative for light-headedness  and numbness.  Hematological:  Does not bruise/bleed easily.  Psychiatric/Behavioral:  Negative for sleep disturbance.      PHYSICAL EXAM:  ECOG PERFORMANCE STATUS: 2 - Symptomatic, <50% confined to bed  Vitals:   03/10/24 1425  Pulse: 86  Resp: 18  Temp: 98.2 F (36.8 C)  SpO2: 96%    Filed Weights   03/10/24 1425  Weight: 191 lb 12.8 oz (87 kg)    Physical Exam Constitutional:      Appearance: Normal appearance. She is obese.     Comments: Nasal cannula in place, on chronic supplemental oxygen  Cardiovascular:     Heart sounds: Normal heart sounds.  Pulmonary:     Breath sounds: Decreased air movement present. Decreased breath sounds present.  Neurological:     General: No focal deficit present.     Mental Status: Mental status is at baseline.  Psychiatric:        Behavior: Behavior normal. Behavior is cooperative.    PAST MEDICAL/SURGICAL HISTORY:  Past Medical History:  Diagnosis Date   Arthritis    Cancer (HCC)    Lupus    Migraines    Past Surgical History:  Procedure Laterality Date   ABDOMINAL HYSTERECTOMY     CHOLECYSTECTOMY      SOCIAL HISTORY:  Social History   Socioeconomic History   Marital status: Widowed    Spouse name: Not on file   Number of children: Not on file   Years of education: Not on file   Highest education level: Not on file  Occupational History   Not on file  Tobacco Use   Smoking status: Every Day    Current packs/day: 1.00    Average packs/day: 1 pack/day for 54.4 years (54.4 ttl pk-yrs)    Types: Cigarettes    Start date: 44   Smokeless tobacco: Never  Substance and Sexual Activity   Alcohol use: No   Drug use: No   Sexual activity: Not on file  Other Topics Concern   Not on file  Social History Narrative   Not on file   Social Drivers of Health   Financial Resource Strain: Medium Risk (06/05/2023)   Received from [facility] [place]    Overall Financial Resource Strain (CARDIA)     Difficulty of Paying Living Expenses: Somewhat hard  Food Insecurity: No Food Insecurity (11/07/2023)   Received from [facility]   Hunger Vital Sign    Worried About Running Out of Food in the Last Year: Never true    Ran Out of Food in the Last Year: Never true  Transportation Needs: No Transportation Needs (11/03/2023)   Received from [facility]   PRAPARE - Transportation    Lack of Transportation (Medical): No    Lack of Transportation (Non-Medical): No  Physical Activity: Inactive (11/07/2023)   Received from [facility]   Exercise Vital Sign    Days of Exercise per Week: 0 days    Minutes of Exercise per Session: 0 min  Stress: Stress Concern Present (06/05/2023)   Received from [facility] [place]    [employer] of Occupational Health - Occupational Stress Questionnaire    Feeling of Stress : To some extent  Social Connections: Moderately Integrated (11/07/2023)   Received from [facility]   Social Connection and Isolation Panel [NHANES]    Frequency of Communication with Friends and Family: More than three times a week    Frequency of Social Gatherings with Friends and Family: Twice a week    Attends Religious Services: More than 4 times per year    Active Member of [employer] or Organizations: Yes    Attends Banker Meetings: 1 to 4 times per year    Marital Status: Widowed  Intimate Partner Violence: Not At Risk (06/05/2023)   Received from [facility] [place]    Humiliation, Afraid, Rape, and Kick questionnaire    Fear of Current or Ex-Partner: No    Emotionally Abused: No    Physically Abused: No    Sexually Abused: No    FAMILY HISTORY:  No family history on file.  CURRENT MEDICATIONS:  Outpatient Encounter Medications as of 03/10/2024  Medication Sig   aspirin EC 81 MG tablet Take 1 tablet (81 mg total) by mouth daily. Swallow whole.   albuterol  (PROAIR  HFA) 108 (90 Base) MCG/ACT inhaler Inhale 2 puffs into  the lungs every 6 (six) hours as needed for wheezing or shortness of breath. 2 puffs every 4 hours as needed only  if your can't catch your breath   atorvastatin (LIPITOR) 20 MG tablet Take 1 tablet by mouth daily.   Baclofen 5 MG TABS Take by mouth.   benzonatate  (TESSALON ) 100 MG capsule Take 1 capsule (100 mg total) by mouth every 8 (eight) hours.   BIOTIN PO Take 1 tablet by mouth daily.   BREZTRI AEROSPHERE 160-9-4.8 MCG/ACT AERO Inhale into the lungs.   busPIRone (BUSPAR) 10 MG tablet Take by mouth.   cetirizine  (ZYRTEC ) 10 MG tablet Take 10 mg by mouth daily.   Cholecalciferol (VITAMIN D3) 5000 units CAPS Take 1 capsule by mouth daily.   cyclobenzaprine (FLEXERIL) 5 MG tablet Take 5 mg by mouth at bedtime.   dexlansoprazole (DEXILANT) 60 MG capsule Take 60 mg by mouth daily.   docusate sodium (COLACE) 100 MG capsule Take 200 mg by mouth daily.   DULoxetine (CYMBALTA) 60 MG capsule Take 60 mg by mouth daily.   escitalopram (LEXAPRO) 5 MG tablet Take by mouth.   folic acid  (FOLVITE ) 1 MG tablet Take 1 mg by mouth daily.   furosemide  (LASIX ) 20 MG tablet Take 1 tablet (20 mg total) by mouth daily.   hydroxychloroquine (PLAQUENIL) 200 MG tablet Take 200 mg by mouth 2 (two) times daily.   ipratropium-albuterol  (DUONEB) 0.5-2.5 (3) MG/3ML SOLN Take 3 mLs by nebulization 4 (four) times daily.   montelukast (SINGULAIR) 10 MG tablet Take 10 mg by mouth at bedtime.   omeprazole (PRILOSEC) 40 MG capsule Take by mouth.   predniSONE (DELTASONE) 5 MG tablet Take 5 mg by mouth daily with breakfast.   pregabalin (LYRICA) 75 MG capsule Take 75 mg by mouth 2 (two) times daily.   rizatriptan (MAXALT) 10 MG tablet Take 10 mg by mouth as needed for migraine.    venlafaxine XR (EFFEXOR-XR) 150 MG 24 hr capsule Take 150 mg by mouth daily.   zolpidem (AMBIEN) 10 MG tablet Take 10 mg by mouth at bedtime.   [DISCONTINUED] albuterol  (PROAIR  HFA)  108 (90 Base) MCG/ACT inhaler 2 puffs every 4 hours as needed only   if your can't catch your breath   [DISCONTINUED] ELIQUIS 5 MG TABS tablet Take 5 mg by mouth 2 (two) times daily.   [DISCONTINUED] ferrous sulfate  325 (65 FE) MG tablet Take 1 tablet (325 mg total) by mouth daily.   [DISCONTINUED] oxyCODONE -acetaminophen  (PERCOCET/ROXICET) 5-325 MG tablet Take 1 tablet by mouth every 6 (six) hours as needed for severe pain (pain score 7-10).   [DISCONTINUED] topiramate (TOPAMAX) 50 MG tablet Take 50 mg by mouth 2 (two) times daily.   [DISCONTINUED] traMADol (ULTRAM) 50 MG tablet Take 50 mg by mouth every 6 (six) hours as needed for moderate pain.   No facility-administered encounter medications on file as of 03/10/2024.    ALLERGIES:  Allergies  Allergen Reactions   Celecoxib Hives   Hydrocodone-Acetaminophen  Itching    Other Reaction(s): analgesics-opioid   Oxycodone -Acetaminophen      Other Reaction(s): analgesics-opioid  rash   Roflumilast Itching    Other Reaction(s): antiasthmatic and bronchodilator agents   Sulfamethoxazole     Other Reaction(s): Allergy to sulfonamides  itching,hives   Sulfamethoxazole-Trimethoprim     Other Reaction(s): anti-infective agents-misc   Tramadol     itching   Leflunomide Itching and Rash    Other Reaction(s): analgesics-anti inflammatory  itching   Sulfa Antibiotics Rash   Sulfasalazine Rash    LABORATORY DATA:  I have reviewed the labs as listed.  CBC    Component Value Date/Time   WBC 7.7 03/10/2024 1304   RBC 5.16 (H) 03/10/2024 1304   HGB 15.1 (H) 03/10/2024 1304   HCT 47.6 (H) 03/10/2024 1304   PLT 204 03/10/2024 1304   MCV 92.2 03/10/2024 1304   MCH 29.3 03/10/2024 1304   MCHC 31.7 03/10/2024 1304   RDW 22.1 (H) 03/10/2024 1304   LYMPHSABS 2.0 03/10/2024 1304   MONOABS 0.7 03/10/2024 1304   EOSABS 0.1 03/10/2024 1304   BASOSABS 0.1 03/10/2024 1304      Latest Ref Rng & Units 01/06/2024   12:59 PM 10/23/2023    1:12 PM 09/28/2023    2:35 PM  CMP  Glucose 70 - 99 mg/dL 161  096  98    BUN 8 - 23 mg/dL 21  18  12    Creatinine 0.44 - 1.00 mg/dL 0.45  4.09  8.11   Sodium 135 - 145 mmol/L 136  135  136   Potassium 3.5 - 5.1 mmol/L 4.1  4.0  3.8   Chloride 98 - 111 mmol/L 101  100  102   CO2 22 - 32 mmol/L 26  26  25    Calcium 8.9 - 10.3 mg/dL 9.4  8.4  8.9   Total Protein 6.5 - 8.1 g/dL 6.7   6.8   Total Bilirubin 0.0 - 1.2 mg/dL 0.2   0.5   Alkaline Phos 38 - 126 U/L 54   75   AST 15 - 41 U/L 16   19   ALT 0 - 44 U/L 11   16     DIAGNOSTIC IMAGING:  I have independently reviewed the relevant imaging and discussed with the patient.   WRAP UP:  All questions were answered. The patient knows to call the clinic with any problems, questions or concerns.  Medical decision making: Moderate  Time spent on visit: I spent 20 minutes counseling the patient face to face. The total time spent in the appointment was 30 minutes and more than 50% was on  counseling.  Sonnie Dusky, PA-C  03/11/24 12:21 PM

## 2024-03-10 ENCOUNTER — Inpatient Hospital Stay: Attending: Hematology | Admitting: Physician Assistant

## 2024-03-10 ENCOUNTER — Inpatient Hospital Stay

## 2024-03-10 VITALS — HR 86 | Temp 98.2°F | Resp 18 | Wt 191.8 lb

## 2024-03-10 DIAGNOSIS — D508 Other iron deficiency anemias: Secondary | ICD-10-CM

## 2024-03-10 DIAGNOSIS — Z862 Personal history of diseases of the blood and blood-forming organs and certain disorders involving the immune mechanism: Secondary | ICD-10-CM | POA: Diagnosis not present

## 2024-03-10 DIAGNOSIS — Z79899 Other long term (current) drug therapy: Secondary | ICD-10-CM | POA: Insufficient documentation

## 2024-03-10 DIAGNOSIS — D509 Iron deficiency anemia, unspecified: Secondary | ICD-10-CM | POA: Insufficient documentation

## 2024-03-10 DIAGNOSIS — D649 Anemia, unspecified: Secondary | ICD-10-CM

## 2024-03-10 DIAGNOSIS — I2693 Single subsegmental pulmonary embolism without acute cor pulmonale: Secondary | ICD-10-CM

## 2024-03-10 LAB — CBC WITH DIFFERENTIAL/PLATELET
Abs Immature Granulocytes: 0.04 10*3/uL (ref 0.00–0.07)
Basophils Absolute: 0.1 10*3/uL (ref 0.0–0.1)
Basophils Relative: 1 %
Eosinophils Absolute: 0.1 10*3/uL (ref 0.0–0.5)
Eosinophils Relative: 1 %
HCT: 47.6 % — ABNORMAL HIGH (ref 36.0–46.0)
Hemoglobin: 15.1 g/dL — ABNORMAL HIGH (ref 12.0–15.0)
Immature Granulocytes: 1 %
Lymphocytes Relative: 25 %
Lymphs Abs: 2 10*3/uL (ref 0.7–4.0)
MCH: 29.3 pg (ref 26.0–34.0)
MCHC: 31.7 g/dL (ref 30.0–36.0)
MCV: 92.2 fL (ref 80.0–100.0)
Monocytes Absolute: 0.7 10*3/uL (ref 0.1–1.0)
Monocytes Relative: 9 %
Neutro Abs: 4.8 10*3/uL (ref 1.7–7.7)
Neutrophils Relative %: 63 %
Platelets: 204 10*3/uL (ref 150–400)
RBC: 5.16 MIL/uL — ABNORMAL HIGH (ref 3.87–5.11)
RDW: 22.1 % — ABNORMAL HIGH (ref 11.5–15.5)
WBC: 7.7 10*3/uL (ref 4.0–10.5)
nRBC: 0 % (ref 0.0–0.2)

## 2024-03-10 LAB — SAMPLE TO BLOOD BANK

## 2024-03-10 LAB — IRON AND TIBC
Iron: 66 ug/dL (ref 28–170)
Saturation Ratios: 19 % (ref 10.4–31.8)
TIBC: 352 ug/dL (ref 250–450)
UIBC: 286 ug/dL

## 2024-03-10 LAB — FERRITIN: Ferritin: 32 ng/mL (ref 11–307)

## 2024-03-10 LAB — D-DIMER, QUANTITATIVE: D-Dimer, Quant: 1.5 ug{FEU}/mL — ABNORMAL HIGH (ref 0.00–0.50)

## 2024-03-10 MED ORDER — ASPIRIN 81 MG PO TBEC: 81.0000 mg | DELAYED_RELEASE_TABLET | Freq: Every day | ORAL | 12 refills | Status: AC

## 2024-03-10 NOTE — Patient Instructions (Addendum)
 [facility] **VISIT SUMMARY & IMPORTANT INSTRUCTIONS **   You were seen today by Sheril Dines PA-C for your follow-up visit.    IRON DEFICIENCY ANEMIA Your blood levels look much better!  (Your hemoglobin is actually slightly higher than normal.  This may be due to mild dehydration or your cigarette use.  Make sure you are drinking plenty of water, and see the attached handout for information regarding smoking cessation.) The results of your iron test are not yet back.  I will keep an eye out for these results and send you a MyChart message with further instruction. We will plan on seeing you for follow-up visit and repeat labs again in 3 months.  BLOOD CLOT IN YOUR LUNGS ("PULMONARY EMBOLISM"): You had a blood clot in your lungs during your hospitalization at [facility].  This may have been mildly provoked by your hospital stay. You are at intermediate (moderate) risk of having a blood clot again in the future.  (Please see the attached handout for important information on symptoms to look out for, in case of another blood clot.) Since you are also at high risk of bleeding from your stomach/intestines, we do NOT recommend that you restart a blood thinner such as Eliquis at this time.  This medicine should only be restarted if you have another blood clot in the future. Please start taking aspirin 81 mg daily.  This is not as strong as a blood thinner like Eliquis, but may offer some protection against blood clots.  FOLLOW-UP APPOINTMENT: 3 months  ** Thank you for trusting me with your healthcare!  I strive to provide all of my patients with quality care at each visit.  If you receive a survey for this visit, I would be so grateful to you for taking the time to provide feedback.  Thank you in advance!  ~ Eean Buss                   Dr. Paulett Boros   &   Sheril Dines, PA-C   - - - - - - - - - - - - - - - - - -    Thank you for choosing [facility] to provide your oncology and hematology care.  To afford each patient quality time with our provider, please arrive at least 15 minutes before your scheduled appointment time.   If you have a lab appointment with the Cancer Center please come in thru the Main Entrance and check in at the main information desk.  You need to re-schedule your appointment should you arrive 10 or more minutes late.  We strive to give you quality time with our providers, and arriving late affects you and other patients whose appointments are after yours.  Also, if you no show three or more times for appointments you may be dismissed from the clinic at the providers discretion.     Again, thank you for choosing [facility].  Our hope is that these requests will decrease the amount of time that you wait before being seen by our physicians.       _____________________________________________________________  Should you have questions after your visit to [facility], please contact our office at [phone] and follow the prompts.  Our office hours are 8:00 a.m. and 4:30 p.m. Monday - Friday.  Please note that voicemails left after 4:00 p.m. may not be returned until  the following business day.  We are closed weekends and major holidays.  You do have access to a nurse 24-7, just call the main number to the clinic [phone] and do not press any options, hold on the line and a nurse will answer the phone.    For prescription refill requests, have your pharmacy contact our office and allow 72 hours.

## 2024-03-11 ENCOUNTER — Encounter: Payer: Self-pay | Admitting: Physician Assistant

## 2024-03-18 ENCOUNTER — Other Ambulatory Visit: Payer: Self-pay

## 2024-03-18 ENCOUNTER — Inpatient Hospital Stay

## 2024-03-18 VITALS — BP 127/70 | HR 65 | Temp 97.6°F | Resp 18

## 2024-03-18 DIAGNOSIS — J449 Chronic obstructive pulmonary disease, unspecified: Secondary | ICD-10-CM

## 2024-03-18 DIAGNOSIS — D508 Other iron deficiency anemias: Secondary | ICD-10-CM

## 2024-03-18 DIAGNOSIS — D509 Iron deficiency anemia, unspecified: Secondary | ICD-10-CM | POA: Diagnosis not present

## 2024-03-18 MED ORDER — METHYLPREDNISOLONE SODIUM SUCC 125 MG IJ SOLR
125.0000 mg | Freq: Once | INTRAMUSCULAR | Status: AC
  Administered 2024-03-18: 125 mg via INTRAVENOUS
  Filled 2024-03-18: qty 2

## 2024-03-18 MED ORDER — SODIUM CHLORIDE 0.9 % IV SOLN: INTRAVENOUS | Status: DC

## 2024-03-18 MED ORDER — CETIRIZINE HCL 10 MG PO TABS
10.0000 mg | ORAL_TABLET | Freq: Once | ORAL | Status: AC
  Administered 2024-03-18: 10 mg via ORAL
  Filled 2024-03-18: qty 1

## 2024-03-18 MED ORDER — ACETAMINOPHEN 325 MG PO TABS
650.0000 mg | ORAL_TABLET | Freq: Once | ORAL | Status: AC
  Administered 2024-03-18: 650 mg via ORAL
  Filled 2024-03-18: qty 2

## 2024-03-18 MED ORDER — FAMOTIDINE IN NACL 20-0.9 MG/50ML-% IV SOLN
20.0000 mg | Freq: Once | INTRAVENOUS | Status: AC
  Administered 2024-03-18: 20 mg via INTRAVENOUS
  Filled 2024-03-18: qty 50

## 2024-03-18 MED ORDER — ALBUTEROL SULFATE HFA 108 (90 BASE) MCG/ACT IN AERS: 2.0000 | INHALATION_SPRAY | Freq: Four times a day (QID) | RESPIRATORY_TRACT | 3 refills | Status: AC | PRN

## 2024-03-18 MED ORDER — SODIUM CHLORIDE 0.9 % IV SOLN
510.0000 mg | Freq: Once | INTRAVENOUS | Status: AC
  Administered 2024-03-18: 510 mg via INTRAVENOUS
  Filled 2024-03-18: qty 510

## 2024-03-18 NOTE — Progress Notes (Signed)
 Patient presents today for Feraheme infusion. Vital signs stable. Patient denies any side effects related to last iron infusion. Patient states her energy levels have increased.

## 2024-03-18 NOTE — Patient Instructions (Signed)
 [facility] [place] - A DEPT OF [facility]  Discharge Instructions: Thank you for choosing [facility] to provide your oncology and hematology care.  If you have a lab appointment with the Cancer Center - please note that after April 8th, 2024, all labs will be drawn in the cancer center.  You do not have to check in or register with the main entrance as you have in the past but will complete your check-in in the cancer center.  Wear comfortable clothing and clothing appropriate for easy access to any Portacath or PICC line.   We strive to give you quality time with your provider. You may need to reschedule your appointment if you arrive late (15 or more minutes).  Arriving late affects you and other patients whose appointments are after yours.  Also, if you miss three or more appointments without notifying the office, you may be dismissed from the clinic at the provider's discretion.      For prescription refill requests, have your pharmacy contact our office and allow 72 hours for refills to be completed.    Today you received the following chemotherapy and/or immunotherapy agents Feraheme. Ferumoxytol Injection What is this medication? FERUMOXYTOL (FER ue MOX i tol) treats low levels of iron in your body (iron deficiency anemia). Iron is a mineral that plays an important role in making red blood cells, which carry oxygen from your lungs to the rest of your body. This medicine may be used for other purposes; ask your health care provider or pharmacist if you have questions. COMMON BRAND NAME(S): Feraheme What should I tell my care team before I take this medication? They need to know if you have any of these conditions: Anemia not caused by low iron levels High levels of iron in the blood Magnetic resonance imaging (MRI) test scheduled An unusual or allergic reaction to iron, other medications, foods, dyes, or preservatives Pregnant or trying to get  pregnant Breastfeeding How should I use this medication? This medication is injected into a vein. It is given by your care team in a hospital or clinic setting. Talk to your care team the use of this medication in children. Special care may be needed. Overdosage: If you think you have taken too much of this medicine contact a poison control center or emergency room at once. NOTE: This medicine is only for you. Do not share this medicine with others. What if I miss a dose? It is important not to miss your dose. Call your care team if you are unable to keep an appointment. What may interact with this medication? Other iron products This list may not describe all possible interactions. Give your health care provider a list of all the medicines, herbs, non-prescription drugs, or dietary supplements you use. Also tell them if you smoke, drink alcohol, or use illegal drugs. Some items may interact with your medicine. What should I watch for while using this medication? Visit your care team for regular checks on your progress. Tell your care team if your symptoms do not start to get better or if they get worse. You may need blood work done while you are taking this medication. You may need to eat more foods that contain iron. Talk to your care team. Foods that contain iron include whole grains or cereals, dried fruits, beans, peas, leafy green vegetables, and organ meats (liver, kidney). What side effects may I notice from receiving this medication? Side effects that  you should report to your care team as soon as possible: Allergic reactions--skin rash, itching, hives, swelling of the face, lips, tongue, or throat Low blood pressure--dizziness, feeling faint or lightheaded, blurry vision Shortness of breath Side effects that usually do not require medical attention (report to your care team if they continue or are bothersome): Flushing Headache Joint pain Muscle pain Nausea Pain, redness, or  irritation at injection site This list may not describe all possible side effects. Call your doctor for medical advice about side effects. You may report side effects to FDA at 1-800-FDA-1088. Where should I keep my medication? This medication is given in a hospital or clinic. It will not be stored at home. NOTE: This sheet is a summary. It may not cover all possible information. If you have questions about this medicine, talk to your doctor, pharmacist, or health care provider.  2024 Elsevier/Gold Standard (2023-05-28 00:00:00)      To help prevent nausea and vomiting after your treatment, we encourage you to take your nausea medication as directed.  BELOW ARE SYMPTOMS THAT SHOULD BE REPORTED IMMEDIATELY: *FEVER GREATER THAN 100.4 F (38 C) OR HIGHER *CHILLS OR SWEATING *NAUSEA AND VOMITING THAT IS NOT CONTROLLED WITH YOUR NAUSEA MEDICATION *UNUSUAL SHORTNESS OF BREATH *UNUSUAL BRUISING OR BLEEDING *URINARY PROBLEMS (pain or burning when urinating, or frequent urination) *BOWEL PROBLEMS (unusual diarrhea, constipation, pain near the anus) TENDERNESS IN MOUTH AND THROAT WITH OR WITHOUT PRESENCE OF ULCERS (sore throat, sores in mouth, or a toothache) UNUSUAL RASH, SWELLING OR PAIN  UNUSUAL VAGINAL DISCHARGE OR ITCHING   Items with * indicate a potential emergency and should be followed up as soon as possible or go to the Emergency Department if any problems should occur.  Please show the CHEMOTHERAPY ALERT CARD or IMMUNOTHERAPY ALERT CARD at check-in to the Emergency Department and triage nurse.  Should you have questions after your visit or need to cancel or reschedule your appointment, please contact [facility] [place] - A DEPT OF Eligha Bridegroom [facility] [phone]  and follow the prompts.  Office hours are 8:00 a.m. to 4:30 p.m. Monday - Friday. Please note that voicemails left after 4:00 p.m. may not be returned until the following business day.  We are closed weekends  and major holidays. You have access to a nurse at all times for urgent questions. Please call the main number to the clinic [phone] and follow the prompts.  For any non-urgent questions, you may also contact your provider using MyChart. We now offer e-Visits for anyone 67 and older to request care online for non-urgent symptoms. For details visit [email or web address].   Also download the MyChart app! Go to the app store, search "MyChart", open the app, select [place], and log in with your MyChart username and password.

## 2024-03-18 NOTE — Progress Notes (Signed)
 Patient tolerated iron infusion with no complaints voiced.  Peripheral IV site clean and dry with good blood return noted before and after infusion.  Band aid applied.  VSS with discharge and left in satisfactory condition with no s/s of distress noted.

## 2024-04-08 ENCOUNTER — Encounter: Payer: Medicare Other | Admitting: Internal Medicine

## 2024-04-19 ENCOUNTER — Ambulatory Visit: Admitting: Internal Medicine

## 2024-06-08 ENCOUNTER — Inpatient Hospital Stay: Attending: Hematology

## 2024-06-10 ENCOUNTER — Inpatient Hospital Stay: Attending: Hematology

## 2024-06-10 DIAGNOSIS — Z882 Allergy status to sulfonamides status: Secondary | ICD-10-CM | POA: Insufficient documentation

## 2024-06-10 DIAGNOSIS — R519 Headache, unspecified: Secondary | ICD-10-CM | POA: Insufficient documentation

## 2024-06-10 DIAGNOSIS — Z7982 Long term (current) use of aspirin: Secondary | ICD-10-CM | POA: Insufficient documentation

## 2024-06-10 DIAGNOSIS — D751 Secondary polycythemia: Secondary | ICD-10-CM | POA: Insufficient documentation

## 2024-06-10 DIAGNOSIS — Z801 Family history of malignant neoplasm of trachea, bronchus and lung: Secondary | ICD-10-CM | POA: Diagnosis not present

## 2024-06-10 DIAGNOSIS — Z886 Allergy status to analgesic agent status: Secondary | ICD-10-CM | POA: Insufficient documentation

## 2024-06-10 DIAGNOSIS — I2699 Other pulmonary embolism without acute cor pulmonale: Secondary | ICD-10-CM | POA: Diagnosis not present

## 2024-06-10 DIAGNOSIS — R5383 Other fatigue: Secondary | ICD-10-CM | POA: Diagnosis not present

## 2024-06-10 DIAGNOSIS — Z91128 Patient's intentional underdosing of medication regimen for other reason: Secondary | ICD-10-CM | POA: Insufficient documentation

## 2024-06-10 DIAGNOSIS — D509 Iron deficiency anemia, unspecified: Secondary | ICD-10-CM | POA: Diagnosis present

## 2024-06-10 DIAGNOSIS — K5900 Constipation, unspecified: Secondary | ICD-10-CM | POA: Insufficient documentation

## 2024-06-10 DIAGNOSIS — Z7901 Long term (current) use of anticoagulants: Secondary | ICD-10-CM | POA: Diagnosis not present

## 2024-06-10 DIAGNOSIS — A419 Sepsis, unspecified organism: Secondary | ICD-10-CM | POA: Diagnosis not present

## 2024-06-10 DIAGNOSIS — Z800 Family history of malignant neoplasm of digestive organs: Secondary | ICD-10-CM | POA: Insufficient documentation

## 2024-06-10 DIAGNOSIS — R911 Solitary pulmonary nodule: Secondary | ICD-10-CM | POA: Diagnosis not present

## 2024-06-10 DIAGNOSIS — R609 Edema, unspecified: Secondary | ICD-10-CM | POA: Diagnosis not present

## 2024-06-10 DIAGNOSIS — D649 Anemia, unspecified: Secondary | ICD-10-CM

## 2024-06-10 DIAGNOSIS — Z881 Allergy status to other antibiotic agents status: Secondary | ICD-10-CM | POA: Diagnosis not present

## 2024-06-10 DIAGNOSIS — Z79899 Other long term (current) drug therapy: Secondary | ICD-10-CM | POA: Diagnosis not present

## 2024-06-10 DIAGNOSIS — F17210 Nicotine dependence, cigarettes, uncomplicated: Secondary | ICD-10-CM | POA: Insufficient documentation

## 2024-06-10 DIAGNOSIS — Z885 Allergy status to narcotic agent status: Secondary | ICD-10-CM | POA: Diagnosis not present

## 2024-06-10 DIAGNOSIS — Z5986 Financial insecurity: Secondary | ICD-10-CM | POA: Diagnosis not present

## 2024-06-10 DIAGNOSIS — Z86711 Personal history of pulmonary embolism: Secondary | ICD-10-CM | POA: Insufficient documentation

## 2024-06-10 DIAGNOSIS — Z90710 Acquired absence of both cervix and uterus: Secondary | ICD-10-CM | POA: Diagnosis not present

## 2024-06-10 DIAGNOSIS — K649 Unspecified hemorrhoids: Secondary | ICD-10-CM | POA: Diagnosis not present

## 2024-06-10 DIAGNOSIS — Z9049 Acquired absence of other specified parts of digestive tract: Secondary | ICD-10-CM | POA: Diagnosis not present

## 2024-06-10 DIAGNOSIS — D508 Other iron deficiency anemias: Secondary | ICD-10-CM

## 2024-06-10 LAB — FERRITIN: Ferritin: 46 ng/mL (ref 11–307)

## 2024-06-10 LAB — CBC WITH DIFFERENTIAL/PLATELET
Abs Immature Granulocytes: 0.03 K/uL (ref 0.00–0.07)
Basophils Absolute: 0.1 K/uL (ref 0.0–0.1)
Basophils Relative: 1 %
Eosinophils Absolute: 0.1 K/uL (ref 0.0–0.5)
Eosinophils Relative: 1 %
HCT: 46.9 % — ABNORMAL HIGH (ref 36.0–46.0)
Hemoglobin: 15.3 g/dL — ABNORMAL HIGH (ref 12.0–15.0)
Immature Granulocytes: 0 %
Lymphocytes Relative: 24 %
Lymphs Abs: 1.9 K/uL (ref 0.7–4.0)
MCH: 32.3 pg (ref 26.0–34.0)
MCHC: 32.6 g/dL (ref 30.0–36.0)
MCV: 99.2 fL (ref 80.0–100.0)
Monocytes Absolute: 0.7 K/uL (ref 0.1–1.0)
Monocytes Relative: 9 %
Neutro Abs: 5.3 K/uL (ref 1.7–7.7)
Neutrophils Relative %: 65 %
Platelets: 177 K/uL (ref 150–400)
RBC: 4.73 MIL/uL (ref 3.87–5.11)
RDW: 13.5 % (ref 11.5–15.5)
WBC: 8.1 K/uL (ref 4.0–10.5)
nRBC: 0 % (ref 0.0–0.2)

## 2024-06-10 LAB — IRON AND TIBC
Iron: 139 ug/dL (ref 28–170)
Saturation Ratios: 42 % — ABNORMAL HIGH (ref 10.4–31.8)
TIBC: 333 ug/dL (ref 250–450)
UIBC: 194 ug/dL

## 2024-06-15 ENCOUNTER — Ambulatory Visit: Admitting: Physician Assistant

## 2024-06-17 NOTE — Progress Notes (Unsigned)
 [facility] [address] [place] [address]   CLINIC:  Medical Oncology/Hematology  PCP:  Toy Laurance POUR, MD [address] [employer] [place] [address] [phone]    REASON FOR VISIT:  Follow-up for iron deficiency anemia and right lung mass  CURRENT THERAPY: IV iron infusions + PRBC transfusions as needed  INTERVAL HISTORY:   Carmen Hansen 73 y.o. female returns for routine follow-up of iron deficiency anemia and right lung mass.   She was last seen by Pleasant Barefoot PA-C on 03/10/2024. She received IV Feraheme x 1 on 03/18/2024.  IRON DEFICIENCY ANEMIA: She felt improved energy after her IV iron, but has had some recurrent fatigue over the past 1 month. Her dyspnea on exertion is back to baseline. She has chronic headaches and occasional dizziness/feeling off balance. She denies any ice pica, lightheadedness, syncope, or chest pain. She has intermittent hemorrhoid bleeding (weekly) dripping into the toilet, but no melena.   PULMONARY EMBOLISM: She has remained off of Eliquis, after her CT scan from April 2025 showed no evidence of recurrent PE.   She reports baseline dyspnea on exertion and denies any chest pain, hemoptysis, or palpitations.  She has chronic intermittent bilateral edema of lower extremities, but denies any unilateral leg swelling.  She has 50% energy and 100% appetite. She endorses that she is maintaining a stable weight.  ASSESSMENT & PLAN:  1.  Severe iron deficiency anemia: - Referral from ER for abnormal labs on 09/28/2023, hemoglobin 8.1, ferritin 6 - Per patient, has had unexplained iron deficiency anemia since around 2022, has received IV iron infusions in [place]  prior to moving to [place] . - Records received via [facility] in [place], [place] Colonoscopy (07/19/2022): Significant fixed looping of colon with moderate to severe diverticulosis.  No bleeding noted. Upper GI endoscopy (07/19/2022): Chronic gastritis characterized  by congestion and erythema in the entire examined stomach (pathology showed mild acute and chronic inflammation, negative for H. pylori and intestinal metaplasia).  Multiple gastric polyps (gastric mucosa with foveolar hyperplasia).  Video capsule enteroscope placed successfully.   ** Video capsule report requested, but not located by hospital in [facility] ** - Unable to tolerate iron pills due to severe constipation. - Most recent IV Feraheme x 1 on 03/18/2024 (with PREMEDICATION due to multiple medication allergies) - Received PRBC x 2 on 01/07/2024 due to Hgb 6.4.   - She reported melanotic stool after starting Eliquis in January 2025 (for treatment of PE) - Stopped Eliquis toward the end of March 2025, and reports resolution of melena. - Intermittent hemorrhoid bleeding.  No melena. - Stool cards not yet returned by patient - Labs (06/10/2024): Hgb 15.3/MCV 99.2.  Ferritin 46, iron saturation 42% Mild erythrocytosis noted.  Likely secondary to tobacco use and mild dehydration.  Would consider additional workup if progressively elevated in the future. - Symptomatic with fatigue. - PLAN: Recommend IV Feraheme x 1 - RTC  in 6 months - Regarding erythrocytosis, patient was given information on smoking cessation counseling and was encouraged to increase her water intake.  2.  Right-sided PE (January 2025) - Hospitalized at [facility] from 11/02/2023 through 11/08/2023 for sepsis related to labial/perirectal abscess, was also found to have small right-sided PE without right heart strain (CTA at [facility] on 11/03/2023) - Venous US  negative for DVT in either lower extremity. - Treated with heparin while inpatient, and was started on Eliquis at discharge.  Hgb trend during hospitalization shows Hgb 12.7 on 11/03/2023, dropped to Hgb 9.8  on 11/08/2023. - Patient reports melanotic stool while taking Eliquis, and had severe iron deficiency anemia requiring blood transfusions - Patient self discontinued Eliquis toward the end of  March 2025 when she ran out of her prescription and also thought she could stop taking it since her CT scan from March did not show the blood clot.   - CTA chest (01/29/2024): No definite evidence of pulmonary embolus. - RISK ANALYSIS - Patient is a INTERMEDIATE RISK of recurrent VTE Pulmonary embolism thought to be weakly provoked in the setting of hospital admission for sepsis and cellulitis. NEGATIVE RISK FACTORS: Denies any preceding travel, injury, or recent surgeries.  She denies any history of hormone supplements, heart failure, IBD, diabetes, or CKD.  She is up-to-date on her age-appropriate cancer screenings.  No personal or family history of DVT, PE, or miscarriages. POSITIVE RISK FACTORS: Decreased mobility at baseline, and spends the majority of her day in a chair with her legs propped up, but reports that she gets up throughout the day and does not spend more than 2 hours sitting at a time.  Persistent risk factors for VTE include smoking, obesity, and hypertension.   - Although patient only completed 2 months of anticoagulation for her PE, CTA chest from 01/29/2024 showed resolution of pulmonary embolism.  She also has high risk of severe and potentially life-threatening bleeding events.  Per discussion with Dr. Katragadda (01/27/2024), we recommend against indefinite anticoagulation.  Although she is intermediate risk of recurrent VTE, her high bleeding risk poses a higher danger. - At today's visit (06/18/24): Reports baseline dyspnea on exertion.  Denies any chest pain, hemoptysis, or palpitations.  Chronic intermittent bilateral edema of lower extremities, but no unilateral leg swelling. - D-dimer 2.27 at [facility] at the time of PE diagnosis (11/02/2023).  D-dimer (01/26/2024) remains elevated, but improved at 1.75. - D-dimer (03/10/2024) trending downward at 1.50 - PLAN: As noted above, recommend against indefinite anticoagulation at this time. - Will start her on aspirin  81 mg daily. - Patient  provided education on alarm symptoms that would prompt immediate medical attention.  3.  History of right lung mass & lung nodules - She reports that she had right apical lung mass in 2018.  PET scan at that time showed hypermetabolic lung mass with mediastinal, right hilar and right supraclavicular nodal disease.  However repeat CT scan few months later reportedly showed resolution of the right apical mass but she is being followed for lung nodules. - Most recent CT chest (01/06/2024) showed no suspicious pulmonary nodularities or masses.   No evidence of recurrent lung cancer or metastatic disease. - PLAN:  Due to current active tobacco use, high risk for lung cancer.  Continue with annual CT chest (LDCT chest/LCS), next due March 2026.    4.  Social/family history: - Lives at home with her sister.  She is on 2 L/min oxygen by nasal cannula 24/7. - Current active smoker, 1 pack/day started at age 72. - Mother died of lung cancer.  Maternal aunt had lung cancer.  Maternal grandmother had liver cancer.  PLAN SUMMARY:  >> IV Feraheme x 1 >> Labs in 6 months = CBC/D, ferritin, iron/TIBC >> OFFICE visit in 6 months (1 week after labs)     REVIEW OF SYSTEMS:   Review of Systems  Constitutional:  Positive for fatigue. Negative for appetite change, chills, diaphoresis, fever and unexpected weight change.  HENT:   Negative for lump/mass and nosebleeds.   Eyes:  Negative for eye problems.  Respiratory:  Positive for cough and shortness of breath (baseline DOE). Negative for hemoptysis.   Cardiovascular:  Negative for chest pain, leg swelling and palpitations.  Gastrointestinal:  Positive for constipation. Negative for abdominal pain, blood in stool, diarrhea, nausea and vomiting.  Genitourinary:  Negative for hematuria.   Skin: Negative.   Neurological:  Positive for dizziness (poor balance) and headaches. Negative for light-headedness and numbness.  Hematological:  Does not bruise/bleed  easily.  Psychiatric/Behavioral:  Negative for sleep disturbance.      PHYSICAL EXAM:  ECOG PERFORMANCE STATUS: 2 - Symptomatic, <50% confined to bed  Vitals:   06/18/24 1159  BP: 135/86  Pulse: 68  Resp: 18  Temp: 97.6 F (36.4 C)  SpO2: 95%   Filed Weights   06/18/24 1159  Weight: 194 lb 3.2 oz (88.1 kg)   Physical Exam Constitutional:      Appearance: Normal appearance. She is obese.  Cardiovascular:     Heart sounds: Normal heart sounds.  Pulmonary:     Breath sounds: Decreased air movement present. Decreased breath sounds present.  Neurological:     General: No focal deficit present.     Mental Status: Mental status is at baseline.  Psychiatric:        Behavior: Behavior normal. Behavior is cooperative.    PAST MEDICAL/SURGICAL HISTORY:  Past Medical History:  Diagnosis Date   Arthritis    Cancer (HCC)    Lupus    Migraines    Past Surgical History:  Procedure Laterality Date   ABDOMINAL HYSTERECTOMY     CHOLECYSTECTOMY      SOCIAL HISTORY:  Social History   Socioeconomic History   Marital status: Widowed    Spouse name: Not on file   Number of children: Not on file   Years of education: Not on file   Highest education level: Not on file  Occupational History   Not on file  Tobacco Use   Smoking status: Every Day    Current packs/day: 1.00    Average packs/day: 1 pack/day for 54.7 years (54.7 ttl pk-yrs)    Types: Cigarettes    Start date: 56   Smokeless tobacco: Never  Substance and Sexual Activity   Alcohol use: No   Drug use: No   Sexual activity: Not on file  Other Topics Concern   Not on file  Social History Narrative   Not on file   Social Drivers of Health   Financial Resource Strain: Medium Risk (06/05/2023)   Received from [facility] [place]    Overall Financial Resource Strain (CARDIA)    Difficulty of Paying Living Expenses: Somewhat hard  Food Insecurity: No Food Insecurity (11/07/2023)   Received from  [facility]   Hunger Vital Sign    Within the past 12 months, you worried that your food would run out before you got the money to buy more.: Never true    Within the past 12 months, the food you bought just didn't last and you didn't have money to get more.: Never true  Transportation Needs: No Transportation Needs (11/03/2023)   Received from [facility]   PRAPARE - Transportation    Lack of Transportation (Medical): No    Lack of Transportation (Non-Medical): No  Physical Activity: Inactive (11/07/2023)   Received from [facility]   Exercise Vital Sign    On average, how many days per week do you engage in moderate to strenuous exercise (like a brisk walk)?: 0 days  On average, how many minutes do you engage in exercise at this level?: 0 min  Stress: Stress Concern Present (06/05/2023)   Received from [facility] [place]    [employer] of Occupational Health - Occupational Stress Questionnaire    Feeling of Stress : To some extent  Social Connections: Moderately Integrated (11/07/2023)   Received from [facility]   Social Connection and Isolation Panel    In a typical week, how many times do you talk on the phone with family, friends, or neighbors?: More than three times a week    How often do you get together with friends or relatives?: Twice a week    How often do you attend church or religious services?: More than 4 times per year    Do you belong to any clubs or organizations such as church groups, unions, fraternal or athletic groups, or school groups?: Yes    How often do you attend meetings of the clubs or organizations you belong to?: 1 to 4 times per year    Are you married, widowed, divorced, separated, never married, or living with a partner?: Widowed  Intimate Partner Violence: Not At Risk (06/05/2023)   Received from [facility] [place]    Humiliation, Afraid, Rape, and Kick questionnaire    Within the last year, have you  been afraid of your partner or ex-partner?: No    Within the last year, have you been humiliated or emotionally abused in other ways by your partner or ex-partner?: No    Within the last year, have you been kicked, hit, slapped, or otherwise physically hurt by your partner or ex-partner?: No    Within the last year, have you been raped or forced to have any kind of sexual activity by your partner or ex-partner?: No    FAMILY HISTORY:  No family history on file.  CURRENT MEDICATIONS:  Outpatient Encounter Medications as of 06/18/2024  Medication Sig   albuterol  (PROAIR  HFA) 108 (90 Base) MCG/ACT inhaler Inhale 2 puffs into the lungs every 6 (six) hours as needed for wheezing or shortness of breath.   aspirin  EC 81 MG tablet Take 1 tablet (81 mg total) by mouth daily. Swallow whole.   atorvastatin (LIPITOR) 20 MG tablet Take 1 tablet by mouth daily.   Baclofen 5 MG TABS Take by mouth.   benzonatate  (TESSALON ) 100 MG capsule Take 1 capsule (100 mg total) by mouth every 8 (eight) hours.   BIOTIN PO Take 1 tablet by mouth daily.   BREZTRI AEROSPHERE 160-9-4.8 MCG/ACT AERO Inhale into the lungs.   busPIRone (BUSPAR) 10 MG tablet Take by mouth.   cetirizine  (ZYRTEC ) 10 MG tablet Take 10 mg by mouth daily.   Cholecalciferol (VITAMIN D3) 5000 units CAPS Take 1 capsule by mouth daily.   cyclobenzaprine (FLEXERIL) 5 MG tablet Take 5 mg by mouth at bedtime.   dexlansoprazole (DEXILANT) 60 MG capsule Take 60 mg by mouth daily.   docusate sodium (COLACE) 100 MG capsule Take 200 mg by mouth daily.   DULoxetine (CYMBALTA) 60 MG capsule Take 60 mg by mouth daily.   escitalopram (LEXAPRO) 5 MG tablet Take by mouth.   folic acid  (FOLVITE ) 1 MG tablet Take 1 mg by mouth daily.   furosemide  (LASIX ) 20 MG tablet Take 1 tablet (20 mg total) by mouth daily.   gabapentin (NEURONTIN) 800 MG tablet Take 800 mg by mouth 4 (four) times daily as needed.   hydroxychloroquine (PLAQUENIL) 200 MG tablet Take  200 mg by  mouth 2 (two) times daily.   ipratropium-albuterol  (DUONEB) 0.5-2.5 (3) MG/3ML SOLN Take 3 mLs by nebulization 4 (four) times daily.   montelukast (SINGULAIR) 10 MG tablet Take 10 mg by mouth at bedtime.   omeprazole (PRILOSEC) 40 MG capsule Take by mouth.   predniSONE (DELTASONE) 5 MG tablet Take 5 mg by mouth daily with breakfast.   pregabalin (LYRICA) 75 MG capsule Take 75 mg by mouth 2 (two) times daily.   rizatriptan (MAXALT) 10 MG tablet Take 10 mg by mouth as needed for migraine.    venlafaxine XR (EFFEXOR-XR) 150 MG 24 hr capsule Take 150 mg by mouth daily.   zolpidem (AMBIEN) 10 MG tablet Take 10 mg by mouth at bedtime.   No facility-administered encounter medications on file as of 06/18/2024.    ALLERGIES:  Allergies  Allergen Reactions   Celecoxib Hives   Hydrocodone-Acetaminophen  Itching    Other Reaction(s): analgesics-opioid   Oxycodone -Acetaminophen      Other Reaction(s): analgesics-opioid  rash   Roflumilast Itching    Other Reaction(s): antiasthmatic and bronchodilator agents   Sulfamethoxazole     Other Reaction(s): Allergy to sulfonamides  itching,hives   Sulfamethoxazole-Trimethoprim     Other Reaction(s): anti-infective agents-misc   Tramadol     itching   Leflunomide Itching and Rash    Other Reaction(s): analgesics-anti inflammatory  itching   Sulfa Antibiotics Rash   Sulfasalazine Rash    LABORATORY DATA:  I have reviewed the labs as listed.  CBC    Component Value Date/Time   WBC 8.1 06/10/2024 1353   RBC 4.73 06/10/2024 1353   HGB 15.3 (H) 06/10/2024 1353   HCT 46.9 (H) 06/10/2024 1353   PLT 177 06/10/2024 1353   MCV 99.2 06/10/2024 1353   MCH 32.3 06/10/2024 1353   MCHC 32.6 06/10/2024 1353   RDW 13.5 06/10/2024 1353   LYMPHSABS 1.9 06/10/2024 1353   MONOABS 0.7 06/10/2024 1353   EOSABS 0.1 06/10/2024 1353   BASOSABS 0.1 06/10/2024 1353      Latest Ref Rng & Units 01/06/2024   12:59 PM 10/23/2023    1:12 PM 09/28/2023    2:35 PM   CMP  Glucose 70 - 99 mg/dL 890  898  98   BUN 8 - 23 mg/dL 21  18  12    Creatinine 0.44 - 1.00 mg/dL 9.11  9.07  9.10   Sodium 135 - 145 mmol/L 136  135  136   Potassium 3.5 - 5.1 mmol/L 4.1  4.0  3.8   Chloride 98 - 111 mmol/L 101  100  102   CO2 22 - 32 mmol/L 26  26  25    Calcium 8.9 - 10.3 mg/dL 9.4  8.4  8.9   Total Protein 6.5 - 8.1 g/dL 6.7   6.8   Total Bilirubin 0.0 - 1.2 mg/dL 0.2   0.5   Alkaline Phos 38 - 126 U/L 54   75   AST 15 - 41 U/L 16   19   ALT 0 - 44 U/L 11   16     DIAGNOSTIC IMAGING:  I have independently reviewed the relevant imaging and discussed with the patient.   WRAP UP:  All questions were answered. The patient knows to call the clinic with any problems, questions or concerns.  Medical decision making: Moderate  Time spent on visit: I spent 20 minutes counseling the patient face to face. The total time spent in the appointment was 30 minutes and  more than 50% was on counseling.  Pleasant CHRISTELLA Barefoot, PA-C  06/18/24 12:31 PM

## 2024-06-18 ENCOUNTER — Inpatient Hospital Stay: Admitting: Physician Assistant

## 2024-06-18 DIAGNOSIS — D649 Anemia, unspecified: Secondary | ICD-10-CM

## 2024-06-18 DIAGNOSIS — D508 Other iron deficiency anemias: Secondary | ICD-10-CM

## 2024-06-18 DIAGNOSIS — D509 Iron deficiency anemia, unspecified: Secondary | ICD-10-CM | POA: Diagnosis not present

## 2024-06-18 NOTE — Patient Instructions (Signed)
 [facility] at [facility] **VISIT SUMMARY & IMPORTANT INSTRUCTIONS **   You were seen today by Pleasant Barefoot PA-C for your follow-up visit.    IRON DEFICIENCY ANEMIA Your blood levels look much better!  (Your hemoglobin is actually slightly higher than normal.  This may be due to mild dehydration or your cigarette use.  Make sure you are drinking plenty of water, and see the attached handout for information regarding smoking cessation.) Your iron levels are mildly low, so we will schedule you for 1 dose of IV iron. We will plan on seeing you for follow-up visit and repeat labs again in 6 months.  BLOOD CLOT IN YOUR LUNGS (PULMONARY EMBOLISM): You had a blood clot in your lungs during your hospitalization at [facility]  This may have been mildly provoked by your hospital stay. You are at intermediate (moderate) risk of having a blood clot again in the future.  (Please see the attached handout for important information on symptoms to look out for, in case of another blood clot.) Since you are also at high risk of bleeding from your stomach/intestines, we do NOT recommend that you restart a blood thinner such as Eliquis at this time.  This medicine should only be restarted if you have another blood clot in the future. Please start taking aspirin  81 mg daily.  This is not as strong as a blood thinner like Eliquis, but may offer some protection against blood clots.  FOLLOW-UP APPOINTMENT: 6 months   ** Thank you for trusting me with your healthcare!  I strive to provide all of my patients with quality care at each visit.  If you receive a survey for this visit, I would be so grateful to you for taking the time to provide feedback.  Thank you in advance!  ~ Rheanne Cortopassi                                        Dr. Mickiel Davonna Pleasant Barefoot, PA-C        Delon Hope, NP   - - - - - - - - - - - - - - - - - -     Thank you for choosing [facility] at  [facility] to provide your oncology and hematology care.  To afford each patient quality time with our provider, please arrive at least 15 minutes before your scheduled appointment time.   If you have a lab appointment with the Cancer Center please come in thru the Main Entrance and check in at the main information desk.  You need to re-schedule your appointment should you arrive 10 or more minutes late.  We strive to give you quality time with our providers, and arriving late affects you and other patients whose appointments are after yours.  Also, if you no show three or more times for appointments you may be dismissed from the clinic at the providers discretion.     Again, thank you for choosing [facility].  Our hope is that these requests will decrease the amount of time that you wait before being seen by our physicians.       _____________________________________________________________  Should you have questions after your visit to [facility], please contact our office at [phone] and follow the prompts.  Our office hours  are 8:00 a.m. and 4:30 p.m. Monday - Friday.  Please note that voicemails left after 4:00 p.m. may not be returned until the following business day.  We are closed weekends and major holidays.  You do have access to a nurse 24-7, just call the main number to the clinic [phone] and do not press any options, hold on the line and a nurse will answer the phone.    For prescription refill requests, have your pharmacy contact our office and allow 72 hours.

## 2024-06-25 ENCOUNTER — Inpatient Hospital Stay: Attending: Hematology

## 2024-06-25 VITALS — BP 140/79 | HR 64 | Temp 97.4°F | Resp 18

## 2024-06-25 DIAGNOSIS — D509 Iron deficiency anemia, unspecified: Secondary | ICD-10-CM | POA: Diagnosis present

## 2024-06-25 DIAGNOSIS — D508 Other iron deficiency anemias: Secondary | ICD-10-CM

## 2024-06-25 MED ORDER — SODIUM CHLORIDE 0.9 % IV SOLN
510.0000 mg | Freq: Once | INTRAVENOUS | Status: AC
  Administered 2024-06-25: 510 mg via INTRAVENOUS
  Filled 2024-06-25: qty 510

## 2024-06-25 MED ORDER — ACETAMINOPHEN 325 MG PO TABS
650.0000 mg | ORAL_TABLET | Freq: Once | ORAL | Status: AC
  Administered 2024-06-25: 650 mg via ORAL
  Filled 2024-06-25: qty 2

## 2024-06-25 MED ORDER — CETIRIZINE HCL 10 MG PO TABS
10.0000 mg | ORAL_TABLET | Freq: Once | ORAL | Status: AC
  Administered 2024-06-25: 10 mg via ORAL
  Filled 2024-06-25: qty 1

## 2024-06-25 MED ORDER — METHYLPREDNISOLONE SODIUM SUCC 125 MG IJ SOLR
125.0000 mg | Freq: Once | INTRAMUSCULAR | Status: AC
  Administered 2024-06-25: 125 mg via INTRAVENOUS
  Filled 2024-06-25: qty 2

## 2024-06-25 MED ORDER — FAMOTIDINE IN NACL 20-0.9 MG/50ML-% IV SOLN
20.0000 mg | Freq: Once | INTRAVENOUS | Status: AC
  Administered 2024-06-25: 20 mg via INTRAVENOUS
  Filled 2024-06-25: qty 50

## 2024-06-25 MED ORDER — SODIUM CHLORIDE 0.9 % IV SOLN
INTRAVENOUS | Status: DC
Start: 2024-06-25 — End: 2024-06-25

## 2024-06-25 NOTE — Progress Notes (Signed)
 Patient presents today for Feraheme infusion per providers order.  Vital signs WNL.  Patient has no new complaints at this time.   Peripheral IV started and blood return noted pre and post infusion.

## 2024-06-25 NOTE — Patient Instructions (Signed)
 [facility] [place] - A DEPT OF [facility]  Discharge Instructions: Thank you for choosing [facility] to provide your oncology and hematology care.  If you have a lab appointment with the Cancer Center - please note that after April 8th, 2024, all labs will be drawn in the cancer center.  You do not have to check in or register with the main entrance as you have in the past but will complete your check-in in the cancer center.  Wear comfortable clothing and clothing appropriate for easy access to any Portacath or PICC line.   We strive to give you quality time with your provider. You may need to reschedule your appointment if you arrive late (15 or more minutes).  Arriving late affects you and other patients whose appointments are after yours.  Also, if you miss three or more appointments without notifying the office, you may be dismissed from the clinic at the provider's discretion.      For prescription refill requests, have your pharmacy contact our office and allow 72 hours for refills to be completed.    Today you received the following chemotherapy and/or immunotherapy agents Feraheme. Ferumoxytol Injection What is this medication? FERUMOXYTOL (FER ue MOX i tol) treats low levels of iron in your body (iron deficiency anemia). Iron is a mineral that plays an important role in making red blood cells, which carry oxygen from your lungs to the rest of your body. This medicine may be used for other purposes; ask your health care provider or pharmacist if you have questions. COMMON BRAND NAME(S): Feraheme What should I tell my care team before I take this medication? They need to know if you have any of these conditions: Anemia not caused by low iron levels High levels of iron in the blood Magnetic resonance imaging (MRI) test scheduled An unusual or allergic reaction to iron, other medications, foods, dyes, or preservatives Pregnant or trying to get  pregnant Breastfeeding How should I use this medication? This medication is injected into a vein. It is given by your care team in a hospital or clinic setting. Talk to your care team the use of this medication in children. Special care may be needed. Overdosage: If you think you have taken too much of this medicine contact a poison control center or emergency room at once. NOTE: This medicine is only for you. Do not share this medicine with others. What if I miss a dose? It is important not to miss your dose. Call your care team if you are unable to keep an appointment. What may interact with this medication? Other iron products This list may not describe all possible interactions. Give your health care provider a list of all the medicines, herbs, non-prescription drugs, or dietary supplements you use. Also tell them if you smoke, drink alcohol, or use illegal drugs. Some items may interact with your medicine. What should I watch for while using this medication? Visit your care team for regular checks on your progress. Tell your care team if your symptoms do not start to get better or if they get worse. You may need blood work done while you are taking this medication. You may need to eat more foods that contain iron. Talk to your care team. Foods that contain iron include whole grains or cereals, dried fruits, beans, peas, leafy green vegetables, and organ meats (liver, kidney). What side effects may I notice from receiving this medication? Side effects that  you should report to your care team as soon as possible: Allergic reactions--skin rash, itching, hives, swelling of the face, lips, tongue, or throat Low blood pressure--dizziness, feeling faint or lightheaded, blurry vision Shortness of breath Side effects that usually do not require medical attention (report to your care team if they continue or are bothersome): Flushing Headache Joint pain Muscle pain Nausea Pain, redness, or  irritation at injection site This list may not describe all possible side effects. Call your doctor for medical advice about side effects. You may report side effects to FDA at 1-800-FDA-1088. Where should I keep my medication? This medication is given in a hospital or clinic. It will not be stored at home. NOTE: This sheet is a summary. It may not cover all possible information. If you have questions about this medicine, talk to your doctor, pharmacist, or health care provider.  2024 Elsevier/Gold Standard (2023-05-28 00:00:00)      To help prevent nausea and vomiting after your treatment, we encourage you to take your nausea medication as directed.  BELOW ARE SYMPTOMS THAT SHOULD BE REPORTED IMMEDIATELY: *FEVER GREATER THAN 100.4 F (38 C) OR HIGHER *CHILLS OR SWEATING *NAUSEA AND VOMITING THAT IS NOT CONTROLLED WITH YOUR NAUSEA MEDICATION *UNUSUAL SHORTNESS OF BREATH *UNUSUAL BRUISING OR BLEEDING *URINARY PROBLEMS (pain or burning when urinating, or frequent urination) *BOWEL PROBLEMS (unusual diarrhea, constipation, pain near the anus) TENDERNESS IN MOUTH AND THROAT WITH OR WITHOUT PRESENCE OF ULCERS (sore throat, sores in mouth, or a toothache) UNUSUAL RASH, SWELLING OR PAIN  UNUSUAL VAGINAL DISCHARGE OR ITCHING   Items with * indicate a potential emergency and should be followed up as soon as possible or go to the Emergency Department if any problems should occur.  Please show the CHEMOTHERAPY ALERT CARD or IMMUNOTHERAPY ALERT CARD at check-in to the Emergency Department and triage nurse.  Should you have questions after your visit or need to cancel or reschedule your appointment, please contact [facility] [place] - A DEPT OF Eligha Bridegroom [facility] [phone]  and follow the prompts.  Office hours are 8:00 a.m. to 4:30 p.m. Monday - Friday. Please note that voicemails left after 4:00 p.m. may not be returned until the following business day.  We are closed weekends  and major holidays. You have access to a nurse at all times for urgent questions. Please call the main number to the clinic [phone] and follow the prompts.  For any non-urgent questions, you may also contact your provider using MyChart. We now offer e-Visits for anyone 67 and older to request care online for non-urgent symptoms. For details visit [email or web address].   Also download the MyChart app! Go to the app store, search "MyChart", open the app, select [place], and log in with your MyChart username and password.

## 2024-08-04 ENCOUNTER — Ambulatory Visit: Admitting: Internal Medicine

## 2024-08-31 ENCOUNTER — Ambulatory Visit: Admitting: Internal Medicine

## 2024-09-06 ENCOUNTER — Encounter: Payer: Self-pay | Admitting: Physician Assistant

## 2024-09-06 NOTE — Progress Notes (Signed)
 Error in charting.

## 2024-09-15 ENCOUNTER — Other Ambulatory Visit: Payer: Self-pay | Admitting: *Deleted

## 2024-09-15 ENCOUNTER — Telehealth: Payer: Self-pay | Admitting: *Deleted

## 2024-09-15 DIAGNOSIS — D649 Anemia, unspecified: Secondary | ICD-10-CM

## 2024-09-15 DIAGNOSIS — D508 Other iron deficiency anemias: Secondary | ICD-10-CM

## 2024-09-15 NOTE — Telephone Encounter (Signed)
 Patient called to advise that she is experiencing profound weakness.  Has Hx of IDA.  Will bring in for CBC/Iron/TIBC & Ferritin on Monday.  Will consult with Pleasant Barefoot, [facility] with recommendations once resulted.

## 2024-09-20 ENCOUNTER — Inpatient Hospital Stay: Attending: Hematology

## 2024-09-20 ENCOUNTER — Ambulatory Visit: Payer: Self-pay | Admitting: *Deleted

## 2024-09-20 DIAGNOSIS — D649 Anemia, unspecified: Secondary | ICD-10-CM

## 2024-09-20 DIAGNOSIS — D509 Iron deficiency anemia, unspecified: Secondary | ICD-10-CM | POA: Diagnosis present

## 2024-09-20 DIAGNOSIS — D508 Other iron deficiency anemias: Secondary | ICD-10-CM

## 2024-09-20 LAB — FERRITIN: Ferritin: 133 ng/mL (ref 11–307)

## 2024-09-20 LAB — IRON AND TIBC
Iron: 98 ug/dL (ref 28–170)
Saturation Ratios: 35 % — ABNORMAL HIGH (ref 10.4–31.8)
TIBC: 281 ug/dL (ref 250–450)
UIBC: 183 ug/dL

## 2024-09-20 LAB — CBC
HCT: 45.3 % (ref 36.0–46.0)
Hemoglobin: 14.6 g/dL (ref 12.0–15.0)
MCH: 32.5 pg (ref 26.0–34.0)
MCHC: 32.2 g/dL (ref 30.0–36.0)
MCV: 100.9 fL — ABNORMAL HIGH (ref 80.0–100.0)
Platelets: 244 K/uL (ref 150–400)
RBC: 4.49 MIL/uL (ref 3.87–5.11)
RDW: 12.5 % (ref 11.5–15.5)
WBC: 9.1 K/uL (ref 4.0–10.5)
nRBC: 0 % (ref 0.0–0.2)

## 2024-09-20 NOTE — Progress Notes (Signed)
 Patient aware and verbalized understanding.

## 2024-10-12 ENCOUNTER — Other Ambulatory Visit: Payer: Self-pay

## 2024-10-12 ENCOUNTER — Emergency Department (HOSPITAL_COMMUNITY)

## 2024-10-12 ENCOUNTER — Emergency Department (HOSPITAL_COMMUNITY)
Admission: EM | Admit: 2024-10-12 | Discharge: 2024-10-12 | Disposition: A | Discharge status: Home / Self Care | Attending: Emergency Medicine | Admitting: Emergency Medicine

## 2024-10-12 ENCOUNTER — Encounter (HOSPITAL_COMMUNITY): Payer: Self-pay

## 2024-10-12 DIAGNOSIS — Z7951 Long term (current) use of inhaled steroids: Secondary | ICD-10-CM | POA: Diagnosis not present

## 2024-10-12 DIAGNOSIS — Z85118 Personal history of other malignant neoplasm of bronchus and lung: Secondary | ICD-10-CM | POA: Diagnosis not present

## 2024-10-12 DIAGNOSIS — Z7982 Long term (current) use of aspirin: Secondary | ICD-10-CM | POA: Insufficient documentation

## 2024-10-12 DIAGNOSIS — N179 Acute kidney failure, unspecified: Secondary | ICD-10-CM | POA: Diagnosis not present

## 2024-10-12 DIAGNOSIS — J4489 Other specified chronic obstructive pulmonary disease: Secondary | ICD-10-CM | POA: Insufficient documentation

## 2024-10-12 DIAGNOSIS — R509 Fever, unspecified: Secondary | ICD-10-CM | POA: Diagnosis present

## 2024-10-12 DIAGNOSIS — D72829 Elevated white blood cell count, unspecified: Secondary | ICD-10-CM | POA: Diagnosis not present

## 2024-10-12 DIAGNOSIS — Z7952 Long term (current) use of systemic steroids: Secondary | ICD-10-CM | POA: Diagnosis not present

## 2024-10-12 DIAGNOSIS — J168 Pneumonia due to other specified infectious organisms: Secondary | ICD-10-CM | POA: Insufficient documentation

## 2024-10-12 DIAGNOSIS — R000 Tachycardia, unspecified: Secondary | ICD-10-CM | POA: Insufficient documentation

## 2024-10-12 DIAGNOSIS — J189 Pneumonia, unspecified organism: Secondary | ICD-10-CM

## 2024-10-12 LAB — COMPREHENSIVE METABOLIC PANEL WITH GFR
ALT: 14 U/L (ref 0–44)
AST: 27 U/L (ref 15–41)
Albumin: 4.4 g/dL (ref 3.5–5.0)
Alkaline Phosphatase: 120 U/L (ref 38–126)
Anion gap: 20 — ABNORMAL HIGH (ref 5–15)
BUN: 20 mg/dL (ref 8–23)
CO2: 21 mmol/L — ABNORMAL LOW (ref 22–32)
Calcium: 10.4 mg/dL — ABNORMAL HIGH (ref 8.9–10.3)
Chloride: 99 mmol/L (ref 98–111)
Creatinine, Ser: 1.32 mg/dL — ABNORMAL HIGH (ref 0.44–1.00)
GFR, Estimated: 43 mL/min — ABNORMAL LOW
Glucose, Bld: 102 mg/dL — ABNORMAL HIGH (ref 70–99)
Potassium: 4.3 mmol/L (ref 3.5–5.1)
Sodium: 140 mmol/L (ref 135–145)
Total Bilirubin: 0.8 mg/dL (ref 0.0–1.2)
Total Protein: 7.7 g/dL (ref 6.5–8.1)

## 2024-10-12 LAB — URINALYSIS, ROUTINE W REFLEX MICROSCOPIC
Bilirubin Urine: NEGATIVE
Glucose, UA: NEGATIVE mg/dL
Hgb urine dipstick: NEGATIVE
Ketones, ur: 20 mg/dL — AB
Nitrite: NEGATIVE
Protein, ur: 100 mg/dL — AB
Specific Gravity, Urine: 1.019 (ref 1.005–1.030)
pH: 5 (ref 5.0–8.0)

## 2024-10-12 LAB — CBC
HCT: 50.8 % — ABNORMAL HIGH (ref 36.0–46.0)
Hemoglobin: 16.9 g/dL — ABNORMAL HIGH (ref 12.0–15.0)
MCH: 32.6 pg (ref 26.0–34.0)
MCHC: 33.3 g/dL (ref 30.0–36.0)
MCV: 98.1 fL (ref 80.0–100.0)
Platelets: 225 K/uL (ref 150–400)
RBC: 5.18 MIL/uL — ABNORMAL HIGH (ref 3.87–5.11)
RDW: 12.9 % (ref 11.5–15.5)
WBC: 14 K/uL — ABNORMAL HIGH (ref 4.0–10.5)
nRBC: 0 % (ref 0.0–0.2)

## 2024-10-12 LAB — LIPASE, BLOOD: Lipase: 17 U/L (ref 11–51)

## 2024-10-12 LAB — RESP PANEL BY RT-PCR (RSV, FLU A&B, COVID)  RVPGX2
Influenza A by PCR: NEGATIVE
Influenza B by PCR: NEGATIVE
Resp Syncytial Virus by PCR: NEGATIVE
SARS Coronavirus 2 by RT PCR: NEGATIVE

## 2024-10-12 LAB — LACTIC ACID, PLASMA: Lactic Acid, Venous: 1.4 mmol/L (ref 0.5–1.9)

## 2024-10-12 MED ORDER — DOXYCYCLINE HYCLATE 100 MG PO CAPS: 100.0000 mg | ORAL_CAPSULE | Freq: Two times a day (BID) | ORAL | 0 refills | Status: AC

## 2024-10-12 MED ORDER — ONDANSETRON HCL 4 MG PO TABS: 4.0000 mg | ORAL_TABLET | Freq: Four times a day (QID) | ORAL | 0 refills | Status: AC

## 2024-10-12 MED ORDER — SODIUM CHLORIDE 0.9 % IV SOLN
1.0000 g | Freq: Once | INTRAVENOUS | Status: AC
  Administered 2024-10-12: 1 g via INTRAVENOUS
  Filled 2024-10-12: qty 10

## 2024-10-12 MED ORDER — SODIUM CHLORIDE 0.9 % IV BOLUS
1000.0000 mL | Freq: Once | INTRAVENOUS | Status: AC
  Administered 2024-10-12: 1000 mL via INTRAVENOUS

## 2024-10-12 MED ORDER — ACETAMINOPHEN 500 MG PO TABS
1000.0000 mg | ORAL_TABLET | Freq: Once | ORAL | Status: AC
  Administered 2024-10-12: 1000 mg via ORAL
  Filled 2024-10-12: qty 2

## 2024-10-12 MED ORDER — ONDANSETRON HCL 4 MG/2ML IJ SOLN
4.0000 mg | Freq: Once | INTRAMUSCULAR | Status: AC
  Administered 2024-10-12: 4 mg via INTRAVENOUS
  Filled 2024-10-12: qty 2

## 2024-10-12 MED ORDER — SODIUM CHLORIDE 0.9 % IV SOLN
500.0000 mg | INTRAVENOUS | Status: DC
  Administered 2024-10-12: 500 mg via INTRAVENOUS
  Filled 2024-10-12: qty 5

## 2024-10-12 NOTE — ED Notes (Signed)
 Provided Pt with crackers and water to attempt PO challenge.

## 2024-10-12 NOTE — ED Triage Notes (Signed)
 Pt arrived via [facility] on 2L O2 via Nasal Cannula at baseline c/o N/V/D and chills X 4 days.

## 2024-10-12 NOTE — ED Provider Notes (Signed)
 " [place] EM[facility]GENC[facility] DEPARTMENT AT [facility] Provider Note   CSN: [id number] Arrival date & time: 10/12/24  1313     Patient presents with: Emesis   Carmen Hansen is a 73 y.o. 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  Denies any recent sick contact.  Denies any chest pain but does endorse some shortness of breath with this.  No abdominal pain.  Denies any dysuria but does endorse increased frequency.  No vaginal symptoms.  Does report that she had fallen about 5 or 6 weeks ago and sustained some left lower rib fractures.    Emesis  Past Medical History:  Diagnosis Date   Arthritis    Cancer (HCC)    Lupus    Migraines    Past Surgical History:  Procedure Laterality Date   ABDOMINAL HYSTERECTOMY     CHOLECYSTECTOMY         Prior to Admission medications  Medication Sig Start Date End Date Taking? Authorizing Provider  doxycycline  (VIBRAMYCIN ) 100 MG capsule Take 1 capsule (100 mg total) by mouth 2 (two) times daily. 10/12/24  Yes Donnajean Lynwood DEL, PA-C  ondansetron  (ZOFRAN ) 4 MG tablet Take 1 tablet (4 mg total) by mouth every 6 (six) hours. 10/12/24  Yes Donnajean Lynwood DEL, PA-C  albuterol  (PROAIR  HFA) 108 (90 Base) MCG/ACT inhaler Inhale 2 puffs into the lungs every 6 (six) hours as needed for wheezing or shortness of breath. 03/18/24   Darlean Ozell NOVAK, MD  aspirin  EC 81 MG tablet Take 1 tablet (81 mg total) by mouth daily. Swallow whole. 03/10/24   Lamon Herter M, PA-C  atorvastatin (LIPITOR) 20 MG tablet Take 1 tablet by mouth daily.    [provider]  Baclofen 5 MG TABS Take by mouth.    [provider]  benzonatate  (TESSALON ) 100 MG capsule Take 1 capsule (100 mg total) by mouth every 8 (eight) hours.  09/23/17   Josem Bring, MD  BIOTIN PO Take 1 tablet by mouth daily.    [provider]  BREZTRI AEROSPHERE 160-9-4.8 MCG/ACT AERO Inhale into the lungs.    [provider]  busPIRone (BUSPAR) 10 MG tablet Take by mouth.    [provider]  cetirizine  (ZYRTEC ) 10 MG tablet Take 10 mg by mouth daily.    [provider]  Cholecalciferol (VITAMIN D3) 5000 units CAPS Take 1 capsule by mouth daily.    [provider]  cyclobenzaprine (FLEXERIL) 5 MG tablet Take 5 mg by mouth at bedtime.    [provider]  dexlansoprazole (DEXILANT) 60 MG capsule Take 60 mg by mouth daily. 03/10/10   [provider]  docusate sodium (COLACE) 100 MG capsule Take 200 mg by mouth daily.    [provider]  DULoxetine (CYMBALTA) 60 MG capsule Take 60 mg by mouth daily. 04/03/10   [provider]  escitalopram (LEXAPRO) 5 MG tablet Take by mouth. 12/12/23   [provider]  folic acid  (FOLVITE ) 1 MG tablet Take 1 mg by mouth daily. 03/09/10   [provider]  furosemide  (LASIX ) 20 MG tablet Take 1 tablet (20 mg total) by mouth daily. 09/28/23   Haviland, Julie, MD  gabapentin (NEURONTIN) 800 MG tablet Take 800 mg by mouth 4 (four) times daily as needed.  05/03/24   [provider]  hydroxychloroquine (PLAQUENIL) 200 MG tablet Take 200 mg by mouth 2 (two) times daily. 01/10/10   [provider]  ipratropium-albuterol  (DUONEB) 0.5-2.5 (3) MG/3ML SOLN Take 3 mLs by nebulization 4 (four) times daily.    [provider]  montelukast (SINGULAIR) 10 MG tablet Take 10 mg by mouth at bedtime. 09/18/17   [provider]  omeprazole (PRILOSEC) 40 MG capsule Take by mouth. 12/09/23   [provider]  predniSONE (DELTASONE) 5 MG tablet Take 5 mg by mouth daily with breakfast.    [provider]  pregabalin (LYRICA) 75 MG capsule Take 75 mg by mouth 2 (two) times daily. 12/17/23   [provider]  rizatriptan (MAXALT) 10 MG tablet Take 10 mg by mouth as needed for migraine.  12/21/09   [provider]  venlafaxine XR (EFFEXOR-XR) 150 MG 24 hr capsule Take 150 mg by mouth daily.    [provider]  zolpidem (AMBIEN) 10 MG tablet Take 10 mg by mouth at bedtime. 03/24/10   [provider]    Allergies: Celecoxib, Hydrocodone-acetaminophen , Oxycodone -acetaminophen , Roflumilast, Sulfamethoxazole, Sulfamethoxazole-trimethoprim, Tramadol, Leflunomide, Sulfa antibiotics, and Sulfasalazine    Review of Systems  Gastrointestinal:  Positive for vomiting.    Updated Vital Signs BP (!) 148/67   Pulse 89   Temp 99.8 F (37.7 C) (Rectal)   Resp 18   Ht 5' 5 (1.651 m)   Wt 88.1 kg   SpO2 97%   BMI 32.32 kg/m   Physical Exam Vitals and nursing note reviewed.  Constitutional:      General: She is not in acute distress.    Appearance: She is well-developed.  HENT:     Head: Normocephalic and atraumatic.  Eyes:     Conjunctiva/sclera: Conjunctivae normal.  Cardiovascular:     Rate and Rhythm: Regular rhythm. Tachycardia present.     Heart sounds: No murmur heard. Pulmonary:     Effort: Pulmonary effort is normal. No respiratory distress.     Breath sounds: Normal breath sounds.     Comments: On baseline 2 L Abdominal:     Palpations: Abdomen is soft.     Tenderness: There is no abdominal tenderness.  Musculoskeletal:        General: No swelling.     Cervical back: Neck supple.     Comments: Mild tenderness over left lower rib, no gross deformities  Skin:    General: Skin is warm and dry.     Capillary Refill: Capillary refill takes less than 2 seconds.  Neurological:     Mental Status: She is alert.  Psychiatric:        Mood and Affect: Mood normal.     (all labs ordered are listed, but only abnormal results are displayed) Labs Reviewed  CBC - Abnormal; Notable for the following components:      Result Value   WBC 14.0 (*)     RBC 5.18 (*)    Hemoglobin 16.9 (*)    HCT 50.8 (*)    All other components within normal limits  URINALYSIS, ROUTINE W REFLEX MICROSCOPIC - Abnormal; Notable for the following components:   Color, Urine AMBER (*)    APPearance HAZY (*)    Ketones, ur 20 (*)    Protein, ur 100 (*)    Leukocytes,Ua TRACE (*)    Bacteria, UA RARE (*)    All other components within normal limits  COMPREHENSIVE METABOLIC PANEL WITH GFR - Abnormal;  Notable for the following components:   CO2 21 (*)    Glucose, Bld 102 (*)    Creatinine, Ser 1.32 (*)    Calcium 10.4 (*)    GFR, Estimated 43 (*)    Anion gap 20 (*)    All other components within normal limits  RESP PANEL BY RT-PCR (RSV, FLU A&B, COVID)  RVPGX2  CULTURE, BLOOD (ROUTINE X 2)  CULTURE, BLOOD (ROUTINE X 2) W REFLEX TO ID PANEL  LIPASE, BLOOD  LACTIC ACID, PLASMA    EKG: EKG Interpretation Date/Time:  Tuesday October 12 2024 13:25:25 EST Ventricular Rate:  111 PR Interval:  132 QRS Duration:  78 QT Interval:  350 QTC Calculation: 476 R Axis:   -13  Text Interpretation: Sinus tachycardia Possible Left atrial enlargement Minimal voltage criteria for LVH, may be normal variant ( R in aVL ) Nonspecific ST abnormality Confirmed by Freddi Hamilton [phone]) on 10/12/2024 2:01:59 PM  Radiology: DG Chest 2 View Result Date: 10/12/2024 CLINICAL DATA:  Shortness of breath EXAM: CHEST - 2 VIEW COMPARISON:  CT 01/29/2024, 11/02/2023 FINDINGS: Hardware in the cervical spine. Treated compression fractures of thoracic and lumbar vertebra. Patchy lingular opacity. Stable cardiomediastinal silhouette with aortic atherosclerosis IMPRESSION: Patchy lingular opacity may be due to atelectasis or pneumonia. Electronically Signed   By: Luke Bun M.D.   On: 10/12/2024 15:37     Procedures   Medications Ordered in the ED  azithromycin  (ZITHROMAX ) 500 mg in sodium chloride  0.9 % 250 mL IVPB (500 mg Intravenous New Bag/Given 10/12/24 1738)   acetaminophen  (TYLENOL ) tablet 1,000 mg (1,000 mg Oral Given 10/12/24 1429)  sodium chloride  0.9 % bolus 1,000 mL (0 mLs Intravenous Stopped 10/12/24 1536)  ondansetron  (ZOFRAN ) injection 4 mg (4 mg Intravenous Given 10/12/24 1428)  cefTRIAXone  (ROCEPHIN ) 1 g in sodium chloride  0.9 % 100 mL IVPB (0 g Intravenous Stopped 10/12/24 1739)    Clinical Course as of 10/12/24 1821  Tue Oct 12, 2024  1401 Patient evaluated for productive cough, congestion, nausea, vomiting and diarrhea over the past few days with associated subjective fevers and chills.  Upon arrival patient is tachycardic to 107 otherwise hemodynamically stable.  She is on her baseline 2 L nasal cannula.  On exam her lungs are clear.  Her abdomen is nontender.  Will begin with routine labs. [JT]  1449 CBC(!) Leukocytosis of 14 [JT]  1450 Comprehensive metabolic panel(!) Creatinine up to 1.32, baseline is 0.8, anion gap of 20 with bicarb of 21 and glucose 102 [JT]  1521 Urinalysis, Routine w reflex microscopic -Urine, Clean Catch(!) Trace leukocytes, negative nitrites, rare bacteria, 6-10 WBCs. [JT]  1553 DG Chest 2 View Lingular opacity, atelectasis or pneumonia [JT]  1556 EKG 12-Lead Sinus tachycardia without ischemic changes [JT]  1742 Recommended admission with patient for pneumonia with AKI.  Patient declines this.  States that she has good support at home.  Has a successful ambulation test and successful p.o. challenge.  She is hemodynamically stable.  Given Rocephin  and azithromycin  here in the ED.  Will send in prescription for Doxy in addition to zofran .  Strict return precautions provided.  Encouraged to follow-up with PCP as well within the next several days.  Patient is understanding and in agreement with plan. [JT]    Clinical Course User Index [JT] Donnajean Lynwood DEL, PA-C  Medical Decision Making Amount and/or Complexity of Data Reviewed Labs: ordered. Decision-making details  documented in ED Course. Radiology: ordered. Decision-making details documented in ED Course. ECG/medicine tests:  Decision-making details documented in ED Course.  Risk OTC drugs. Prescription drug management.   This patient presents to the ED with chief complaint(s) of Fever .  The complaint involves an extensive differential diagnosis and also carries with it a high risk of complications and morbidity.   Pertinent past medical history as listed in HPI  The differential diagnosis includes  URI, pneumonia, UTI, acute abdominal pathology Additional history obtained: Records reviewed Care Everywhere/External Records  Disposition:   Patient will be discharged home. The patient has been appropriately medically screened and/or stabilized in the ED. I have low suspicion for any other emergent medical condition which would require further screening, evaluation or treatment in the ED or require inpatient management. At time of discharge the patient is hemodynamically stable and in no acute distress. I have discussed work-up results and diagnosis with patient and answered all questions. Patient is agreeable with discharge plan. We discussed strict return precautions for returning to the emergency department and they verbalized understanding.     Social Determinants of Health:   none  This note was dictated with voice recognition software.  Despite best efforts at proofreading, errors may have occurred which can change the documentation meaning.       Final diagnoses:  Pneumonia due to infectious organism, unspecified laterality, unspecified part of lung  AKI (acute kidney injury)    ED Discharge Orders          Ordered    doxycycline  (VIBRAMYCIN ) 100 MG capsule  2 times daily        10/12/24 1817    ondansetron  (ZOFRAN ) 4 MG tablet  Every 6 hours        10/12/24 1817               Donnajean Lynwood DEL, PA-C 10/12/24 1821    Elnor Jayson LABOR, DO 10/18/24 1540  "

## 2024-10-12 NOTE — Discharge Instructions (Addendum)
 You were found to have pneumonia and dehydration.  A prescription for antibiotics was sent to your pharmacy.  Please be sure to complete the full course of antibiotics.  You additionally provided a prescription for Zofran  for nausea.  Please be sure to drink plenty of fluids.  If you experience any new or worsening symptoms please return to the emergency room.  Otherwise please follow-up with your primary care doctor for further evaluation.

## 2024-10-17 LAB — CULTURE, BLOOD (ROUTINE X 2)
Culture: NO GROWTH
Culture: NO GROWTH
Special Requests: ADEQUATE

## 2024-10-18 ENCOUNTER — Encounter: Payer: Self-pay | Admitting: *Deleted

## 2024-11-03 ENCOUNTER — Ambulatory Visit: Admitting: Internal Medicine

## 2024-11-03 DIAGNOSIS — J9611 Chronic respiratory failure with hypoxia: Secondary | ICD-10-CM

## 2024-11-03 DIAGNOSIS — J449 Chronic obstructive pulmonary disease, unspecified: Secondary | ICD-10-CM

## 2024-11-03 NOTE — Progress Notes (Unsigned)
 "   Carmen Hansen, female    DOB: 15-Nov-1950    MRN: [id number]   Brief patient profile:  74  yowf  active smoker  referred to pulmonary clinic in [place]  11/25/2023 by EDP- [facility]  for copd pred dep since 1999 / 02 dep x 2015    Discharge date: November 08, 2023 Length of stay: LOS: 5 days   Admission HPI   Patient admitted on: 11/02/2023 10:50 PM   CHIEF COMPLAINT: Fever/chills shortness of breath  Day of admission HPI:   This is a 74 y.o. female with a known history of RA, asthma/COPD on 2L home O2, HTN, HLD, lung cancer in remission, depression, anxiety, GERD, insomnia, prediabetes presents to the emergency department for evaluation of two week history of worsening SOB associated with cough and fever to 103 at home. In ER she was placed on BiPAP, was found to meet sepsis criteria and have a small PE without right heart strain. She is feeling better with BiPAP  In the ED the patient received cefepime,vancomycin, heparin, duoneb, methylpred. Medical admission was requested for further workup and management of acute on chronic hypoxic respiratory failure, PE, sepsis. She was admitted as an inpatient to the hospital service and initiated on IV heparin for her PE. Patient was also seen by general surgery for her perirectal abscess, status post I&D patient was admitted for sepsis right labial cellulitis.   She was initiated on broad-spectrum antibiotics this was de-escalated to Augmentin. Her respiratory status was closely monitored in the ICU. Patient was transition from IV heparin to Eliquis on the day of discharge she was hemodynamically stable and seen by general surgery patient is to follow-up with primary care physician and general surgery as scheduled. She is currently on Eliquis for her PE and p.o. Augmentin All acute and chronic comorbid's were addressed risk of decompensation mortality morbidity and death is moderate to high if the patient does not follow recommendations medications  follow-up appointments and compliance  Patient admitted on Home O2? -Yes Patient on home anticoagulant? -No Patient admitted with Chronic home foley catheter? - no Foley catheter placed or replaced by another service prior to admission? - no       ASSESSMENT & PLAN (In order of descending acuity) Right labial cellulitis/right perirectal abscess PE  Chronic medical problems COPD Hypertension History of lung cancer in remission Diabetes type 2      History of Present Illness  11/25/2023  Pulmonary/ 1st office eval/ Donata Reddick / [place] [facility]  pred 2.5 mg /day x 1999/ now on Eliquis for R DVT / small PE Chief Complaint  Patient presents with   Consult    Copd   Dyspnea:  doesn't need 02 at rest, drops with walking / housebound x nov 22 25  Cough: x years sporadic  > clear  Sleep: flat bed / 2 pillows  SABA use: none  02: 2lpm hs/ up to 3lpm  Rec We will try to get your lung functions from Dr Rosslyn office but it may dificult as they are closed. No change in medications  Make sure you check your oxygen saturation  AT  your highest level of activity (not after you stop)   to be sure it stays over 90%  The key is to stop smoking completely before smoking completely stops you!  Please schedule a follow up office visit in 6 weeks, call sooner if needed - bring inhalers     01/13/2024  f/u ov/ office/Brenae Lasecki re: GOLD 0  copd/ AB clinically and mild emphysema on CT maint on Breztri   Chief Complaint  Patient presents with   Follow-up    Follow up for copd, when to the hospital  for a blood transfusion , she was taking eliquis 5mg  but she has taking in 1 week because she is out of refills.  Dyspnea: walking  room to room at home better  Cough: moderately congested > slt yellow  Sleeping: flat bed bed 2 pillows s    resp cc  SABA use: doesn't have one  02: 2lpm hs and prn daytime Rec Zpak for your congested cough  Proaire (called in = albuterol ) Only use your albuterol   as a rescue medication Also  Ok to try albuterol  15 min before an activity (on alternating days)  that you know would usually make you short of breath Work on inhaler technique:   Make sure you check your oxygen saturation  AT  your highest level of activity (not after you stop)   to be sure it stays over 90%   The key is to stop smoking completely before smoking completely stops you!  Please schedule a follow up visit in 3 months but call sooner if needed with PFTs on return     .02/20/2024  f/u ov/[place] office/Jazel Nimmons re: GOLD 0 copd/ AB clinically and mild emphysema on CT maint on Breztri   Chief Complaint  Patient presents with   COPD   Shortness of Breath  Dyspnea:  walking s rollator  Cough: smoker's rattle worse p supper / min mucoid production  Sleeping: flat bed 2 pillow    resp cc  SABA use: rarely hfa/ neb only for exac 02: 2lpm hs and prn daytime with sats 93% Rec Work on inhaler technique:   Make sure you check your oxygen saturation  AT  your highest level of activity (not after you stop)   to be sure it stays over 90% a  Try add pepcid  20 mg after supper for a month to see if helps cough and eat a small supper  Please schedule a follow up visit in 6  months but call sooner if needed    11/03/2024  f/u ov/[place] office/Vana Arif re: GOLD 0 copd/ AB clinically and mild emphysema on CT maint on ***  No chief complaint on file.   Dyspnea:  *** Cough: *** Sleeping: ***   resp cc  SABA use: *** 02: ***  Lung cancer screening: ***   No obvious day to day or daytime variability or assoc excess/ purulent sputum or mucus plugs or hemoptysis or cp or chest tightness, subjective wheeze or overt sinus or hb symptoms.    Also denies any obvious fluctuation of symptoms with weather or environmental changes or other aggravating or alleviating factors except as outlined above   No unusual exposure hx or h/o childhood pna/ asthma or knowledge of premature birth.  Current  Allergies, Complete Past Medical History, Past Surgical History, Family History, and Social History were reviewed in [employer] record.  ROS  The following are not active complaints unless bolded Hoarseness, sore throat, dysphagia, dental problems, itching, sneezing,  nasal congestion or discharge of excess mucus or purulent secretions, ear ache,   fever, chills, sweats, unintended wt loss or wt gain, classically pleuritic or exertional cp,  orthopnea pnd or arm/hand swelling  or leg swelling, presyncope, palpitations, abdominal pain, anorexia, nausea, vomiting, diarrhea  or change in bowel habits or change in bladder habits, change in  stools or change in urine, dysuria, hematuria,  rash, arthralgias, visual complaints, headache, numbness, weakness or ataxia or problems with walking or coordination,  change in mood or  memory.         Outpatient Medications Prior to Visit  Medication Sig Dispense Refill   albuterol  (PROAIR  HFA) 108 (90 Base) MCG/ACT inhaler Inhale 2 puffs into the lungs every 6 (six) hours as needed for wheezing or shortness of breath. 18 g 3   aspirin  EC 81 MG tablet Take 1 tablet (81 mg total) by mouth daily. Swallow whole. 30 tablet 12   atorvastatin (LIPITOR) 20 MG tablet Take 1 tablet by mouth daily.     Baclofen 5 MG TABS Take by mouth.     benzonatate  (TESSALON ) 100 MG capsule Take 1 capsule (100 mg total) by mouth every 8 (eight) hours. 21 capsule 0   BIOTIN PO Take 1 tablet by mouth daily.     BREZTRI AEROSPHERE 160-9-4.8 MCG/ACT AERO Inhale into the lungs.     busPIRone (BUSPAR) 10 MG tablet Take by mouth.     cetirizine  (ZYRTEC ) 10 MG tablet Take 10 mg by mouth daily.     Cholecalciferol (VITAMIN D3) 5000 units CAPS Take 1 capsule by mouth daily.     cyclobenzaprine (FLEXERIL) 5 MG tablet Take 5 mg by mouth at bedtime.     dexlansoprazole (DEXILANT) 60 MG capsule Take 60 mg by mouth daily.     docusate sodium (COLACE) 100 MG capsule Take 200 mg  by mouth daily.     doxycycline  (VIBRAMYCIN ) 100 MG capsule Take 1 capsule (100 mg total) by mouth 2 (two) times daily. 10 capsule 0   DULoxetine (CYMBALTA) 60 MG capsule Take 60 mg by mouth daily.     escitalopram (LEXAPRO) 5 MG tablet Take by mouth.     folic acid  (FOLVITE ) 1 MG tablet Take 1 mg by mouth daily.     furosemide  (LASIX ) 20 MG tablet Take 1 tablet (20 mg total) by mouth daily. 30 tablet 0   gabapentin (NEURONTIN) 800 MG tablet Take 800 mg by mouth 4 (four) times daily as needed.     hydroxychloroquine (PLAQUENIL) 200 MG tablet Take 200 mg by mouth 2 (two) times daily.     ipratropium-albuterol  (DUONEB) 0.5-2.5 (3) MG/3ML SOLN Take 3 mLs by nebulization 4 (four) times daily.     montelukast (SINGULAIR) 10 MG tablet Take 10 mg by mouth at bedtime.     omeprazole (PRILOSEC) 40 MG capsule Take by mouth.     ondansetron  (ZOFRAN ) 4 MG tablet Take 1 tablet (4 mg total) by mouth every 6 (six) hours. 12 tablet 0   predniSONE (DELTASONE) 5 MG tablet Take 5 mg by mouth daily with breakfast.     pregabalin (LYRICA) 75 MG capsule Take 75 mg by mouth 2 (two) times daily.     rizatriptan (MAXALT) 10 MG tablet Take 10 mg by mouth as needed for migraine.      venlafaxine XR (EFFEXOR-XR) 150 MG 24 hr capsule Take 150 mg by mouth daily.     zolpidem (AMBIEN) 10 MG tablet Take 10 mg by mouth at bedtime.     No facility-administered medications prior to visit.        Past Medical History:  Diagnosis Date   Arthritis    Cancer (HCC)    Lupus    Migraines       Objective:    Wts  11/03/2024         ***  02/20/2024         194       11/25/23 202 lb 12.8 oz (92 kg)  10/08/23 215 lb 12.8 oz (97.9 kg)  09/28/23 221 lb (100.2 kg)    Vital signs reviewed  11/03/2024  - Note at rest 02 sats  ***% on ***   General appearance:    ***    Min barr***                    Assessment        "

## 2024-11-24 ENCOUNTER — Ambulatory Visit: Admitting: Internal Medicine

## 2024-12-13 ENCOUNTER — Inpatient Hospital Stay: Attending: Hematology

## 2024-12-20 ENCOUNTER — Ambulatory Visit: Admitting: Physician Assistant

## 2025-01-04 ENCOUNTER — Inpatient Hospital Stay: Attending: Hematology | Admitting: Physician Assistant
